# Patient Record
Sex: Female | Born: 1995 | Race: Black or African American | Hispanic: No | Marital: Single | Smoking: Current every day smoker
Health system: Southern US, Community
[De-identification: ages and names within clinical notes are randomized; demographics above are authoritative.]

## PROBLEM LIST (undated history)

## (undated) ENCOUNTER — Emergency Department (HOSPITAL_COMMUNITY): Disposition: A | Discharge status: Home / Self Care | Payer: Self-pay

## (undated) DIAGNOSIS — I10 Essential (primary) hypertension: Secondary | ICD-10-CM

## (undated) DIAGNOSIS — K5900 Constipation, unspecified: Secondary | ICD-10-CM

## (undated) DIAGNOSIS — J302 Other seasonal allergic rhinitis: Secondary | ICD-10-CM

---
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
## 2014-01-11 ENCOUNTER — Encounter (HOSPITAL_COMMUNITY): Payer: Self-pay | Admitting: Emergency Medicine

## 2014-01-11 ENCOUNTER — Emergency Department (HOSPITAL_COMMUNITY): Payer: Medicaid Other

## 2014-01-11 ENCOUNTER — Emergency Department (HOSPITAL_COMMUNITY)
Admission: EM | Admit: 2014-01-11 | Discharge: 2014-01-11 | Disposition: A | Discharge status: Home / Self Care | Payer: Medicaid Other | Attending: Emergency Medicine | Admitting: Emergency Medicine

## 2014-01-11 DIAGNOSIS — I10 Essential (primary) hypertension: Secondary | ICD-10-CM | POA: Diagnosis not present

## 2014-01-11 DIAGNOSIS — Z8719 Personal history of other diseases of the digestive system: Secondary | ICD-10-CM | POA: Insufficient documentation

## 2014-01-11 DIAGNOSIS — F17200 Nicotine dependence, unspecified, uncomplicated: Secondary | ICD-10-CM | POA: Insufficient documentation

## 2014-01-11 DIAGNOSIS — J069 Acute upper respiratory infection, unspecified: Secondary | ICD-10-CM | POA: Diagnosis not present

## 2014-01-11 DIAGNOSIS — R110 Nausea: Secondary | ICD-10-CM | POA: Diagnosis not present

## 2014-01-11 DIAGNOSIS — Z880 Allergy status to penicillin: Secondary | ICD-10-CM | POA: Diagnosis not present

## 2014-01-11 DIAGNOSIS — H9209 Otalgia, unspecified ear: Secondary | ICD-10-CM | POA: Insufficient documentation

## 2014-01-11 DIAGNOSIS — R52 Pain, unspecified: Secondary | ICD-10-CM | POA: Diagnosis present

## 2014-01-11 MED ORDER — FLUTICASONE PROPIONATE 50 MCG/ACT NA SUSP: 2.0000 | Freq: Every day | NASAL | Status: DC

## 2014-01-11 MED ORDER — GUAIFENESIN 100 MG/5ML PO LIQD: 100.0000 mg | ORAL | Status: DC | PRN

## 2014-01-11 NOTE — ED Notes (Signed)
Refused Wheelchair 

## 2014-01-11 NOTE — ED Notes (Signed)
Pt states that she has had cough, congestion, generalized body aches, fever, wheezing for 1 week. Pt state that she has taken OTC medications with no relief

## 2014-01-11 NOTE — ED Provider Notes (Signed)
CSN: [id number]     Arrival date & time 01/11/14  1443 History  This chart was scribed for non-physician practitioner, Junius Finner, PA-C working with Richardean Canal, MD by Karle Plumber, ED scribe. This patient was seen in room TR05C/TR05C and the patient's care was started at 4:32 PM.   Chief Complaint  Patient presents with  . Cough  . Generalized Body Aches   The history is provided by the patient. No language interpreter was used.   HPI Comments: Donna Mcclain is a 18 y.o. female who presents to the Emergency Department complaining of generalized body aches, fever Tmax 101 degrees and congestion. She reports associated sore throat and otalgia. She reports a productive cough of yellowish-green sputum. Pt reports some nausea as well. She has been taking Mucinex for the cough and congestion and Tylenol and Motrin for the body aches with no significant relief of the symptoms. She denies vomiting. She is a smoker of about 3 cigarettes daily.  Past Medical History  Diagnosis Date  . Constipation   . Seasonal allergies   . Hypertension    History reviewed. No pertinent past surgical history. Family History  Problem Relation Age of Onset  . Migraines Sister    History  Substance Use Topics  . Smoking status: Current Every Day Smoker -- 0.50 packs/day    Types: Cigarettes  . Smokeless tobacco: Not on file  . Alcohol Use: No   OB History   Grav Para Term Preterm Abortions TAB SAB Ect Mult Living                 Review of Systems  Constitutional: Positive for fever.  HENT: Positive for congestion, ear pain and sore throat.   Respiratory: Positive for cough and wheezing.   Gastrointestinal: Positive for nausea. Negative for vomiting.  Musculoskeletal: Positive for myalgias (generalized body aches).  All other systems reviewed and are negative.  Allergies  Cephalosporins and Penicillins  Home Medications   Prior to Admission medications   Medication Sig Start Date End  Date Taking? Authorizing Provider  acetaminophen (TYLENOL) 325 MG tablet Take 650 mg by mouth every 6 (six) hours as needed for mild pain, fever or headache.   Yes Historical Provider, MD  ibuprofen (ADVIL,MOTRIN) 200 MG tablet Take 400 mg by mouth every 6 (six) hours as needed for fever, headache or mild pain.   Yes Historical Provider, MD  fluticasone (FLONASE) 50 MCG/ACT nasal spray Place 2 sprays into both nostrils daily. 01/11/14   Junius Finner, PA-C  guaiFENesin (ROBITUSSIN) 100 MG/5ML liquid Take 5-10 mLs (100-200 mg total) by mouth every 4 (four) hours as needed for cough. 01/11/14   Junius Finner, PA-C   Triage Vitals:  BP 122/81  Pulse 86  Temp(Src) 98.3 F (36.8 C) (Oral)  Resp 20  SpO2 100%  LMP 11/20/2013  Physical Exam  Nursing note and vitals reviewed. Constitutional: She is oriented to person, place, and time. She appears well-developed and well-nourished.  HENT:  Head: Normocephalic and atraumatic.  Right Ear: Tympanic membrane normal.  Left Ear: Tympanic membrane normal.  Nose: Mucosal edema present.  Mouth/Throat: Uvula is midline, oropharynx is clear and moist and mucous membranes are normal.  Eyes: EOM are normal.  Neck: Normal range of motion.  Cardiovascular: Normal rate, regular rhythm and normal heart sounds.  Exam reveals no gallop and no friction rub.   No murmur heard. Pulmonary/Chest: Effort normal and breath sounds normal. No respiratory distress. She has no wheezes. She  has no rales.  Musculoskeletal: Normal range of motion.  Neurological: She is alert and oriented to person, place, and time.  Skin: Skin is warm and dry.  Psychiatric: She has a normal mood and affect. Her behavior is normal.    ED Course  Procedures (including critical care time) DIAGNOSTIC STUDIES: Oxygen Saturation is 100% on RA, normal by my interpretation.   COORDINATION OF CARE: 4:35 PM- Will order CXR. Pt verbalizes understanding and agrees to plan.  Medications - No data  to display  Labs Review Labs Reviewed - No data to display  Imaging Review Dg Chest 2 View  01/11/2014   CLINICAL DATA:  Cough.  EXAM: CHEST  2 VIEW  COMPARISON:  January 04, 2013.  FINDINGS: The heart size and mediastinal contours are within normal limits. Both lungs are clear. No pneumothorax or pleural effusion is noted. The visualized skeletal structures are unremarkable.  IMPRESSION: No acute cardiopulmonary abnormality seen.   Electronically Signed   By: Roque Lias M.D.   On: 01/11/2014 17:24     EKG Interpretation None      MDM   Final diagnoses:  URI, acute    Pt is a 18yo female presenting to ED with c/o URI type symptoms, no respiratory distress. Afebrile in ED.  Lungs: CTAB. CXR: no acute cardiopulmonary abnormality seen.  Will tx conservatively. Advised pt to use acetaminophen and ibuprofen as needed for fever and pain. Encouraged rest and fluids. Return precautions provided. Pt verbalized understanding and agreement with tx plan.   I personally performed the services described in this documentation, which was scribed in my presence. The recorded information has been reviewed and is accurate.  Junius Finner, PA-C 01/12/14 1623

## 2014-01-14 NOTE — ED Provider Notes (Signed)
Medical screening examination/treatment/procedure(s) were performed by non-physician practitioner and as supervising physician I was immediately available for consultation/collaboration.   EKG Interpretation None        Richardean Canal, MD 01/14/14 [phone]

## 2014-04-20 ENCOUNTER — Encounter (HOSPITAL_COMMUNITY): Payer: Self-pay | Admitting: *Deleted

## 2014-04-20 ENCOUNTER — Emergency Department (HOSPITAL_COMMUNITY)
Admission: EM | Admit: 2014-04-20 | Discharge: 2014-04-20 | Disposition: A | Discharge status: Home / Self Care | Payer: Medicaid Other | Attending: Emergency Medicine | Admitting: Emergency Medicine

## 2014-04-20 DIAGNOSIS — Z880 Allergy status to penicillin: Secondary | ICD-10-CM | POA: Insufficient documentation

## 2014-04-20 DIAGNOSIS — I10 Essential (primary) hypertension: Secondary | ICD-10-CM | POA: Insufficient documentation

## 2014-04-20 DIAGNOSIS — Z720 Tobacco use: Secondary | ICD-10-CM | POA: Diagnosis not present

## 2014-04-20 DIAGNOSIS — L03213 Periorbital cellulitis: Secondary | ICD-10-CM

## 2014-04-20 DIAGNOSIS — H00031 Abscess of right upper eyelid: Secondary | ICD-10-CM | POA: Insufficient documentation

## 2014-04-20 DIAGNOSIS — Z7951 Long term (current) use of inhaled steroids: Secondary | ICD-10-CM | POA: Insufficient documentation

## 2014-04-20 DIAGNOSIS — Z8719 Personal history of other diseases of the digestive system: Secondary | ICD-10-CM | POA: Diagnosis not present

## 2014-04-20 MED ORDER — CLINDAMYCIN HCL 300 MG PO CAPS: 300.0000 mg | ORAL_CAPSULE | Freq: Four times a day (QID) | ORAL | Status: DC

## 2014-04-20 NOTE — ED Provider Notes (Signed)
CSN: [id number]     Arrival date & time 04/20/14  1557 History  This chart was scribed for non-physician practitioner working with Tilden FossaElizabeth Rees, MD by Elveria Risingimelie Horne, ED Scribe. This patient was seen in room TR04C/TR04C and the patient's care was started at 5:05 PM.   Chief Complaint  Patient presents with  . Eye Problem   The history is provided by the patient. No language interpreter was used.   HPI Comments: Donna Mcclain is a 18 y.o. female with PMHx of Hypertension who presents to the Emergency Department complaining of small lump to right upper eyelid upon wakening three days ago. Patient reports development of increased swelling, itching, drainage and pain at the site. Patient states that last night when she woke up for work her eyelids where swollen shut. This morning patient reports awakening to crust/crusting. Patient reports blurred vision attributed to constant drainage. Patient denies known injury to the eye.    Past Medical History  Diagnosis Date  . Constipation   . Seasonal allergies   . Hypertension    History reviewed. No pertinent past surgical history. Family History  Problem Relation Age of Onset  . Migraines Sister    History  Substance Use Topics  . Smoking status: Current Every Day Smoker -- 0.50 packs/day    Types: Cigarettes  . Smokeless tobacco: Not on file  . Alcohol Use: No   OB History    No data available     Review of Systems  Constitutional: Negative for fever and chills.  Eyes: Positive for pain, discharge and itching.  All other systems reviewed and are negative.   Allergies  Cephalosporins and Penicillins  Home Medications   Prior to Admission medications   Medication Sig Start Date End Date Taking? Authorizing Provider  acetaminophen (TYLENOL) 325 MG tablet Take 650 mg by mouth every 6 (six) hours as needed for mild pain, fever or headache.    Historical Provider, MD  fluticasone (FLONASE) 50 MCG/ACT nasal spray Place 2  sprays into both nostrils daily. 01/11/14   Junius FinnerErin O'Malley, PA-C  guaiFENesin (ROBITUSSIN) 100 MG/5ML liquid Take 5-10 mLs (100-200 mg total) by mouth every 4 (four) hours as needed for cough. 01/11/14   Junius FinnerErin O'Malley, PA-C  ibuprofen (ADVIL,MOTRIN) 200 MG tablet Take 400 mg by mouth every 6 (six) hours as needed for fever, headache or mild pain.    Historical Provider, MD   Triage Vitals: BP 135/83 mmHg  Pulse 113  Temp(Src) 98.4 F (36.9 C) (Oral)  Resp 18  Ht 5\' 2"  (1.575 m)  Wt 165 lb (74.844 kg)  BMI 30.17 kg/m2  SpO2 99%  LMP 04/13/2014 Physical Exam  Constitutional: She is oriented to person, place, and time. She appears well-developed and well-nourished. No distress.  HENT:  Head: Normocephalic and atraumatic.  Eyes: EOM are normal.  Swelling to upper lid of right eye. No obvious stye noted.   Neck: Neck supple. No tracheal deviation present.  Cardiovascular: Normal rate.   Pulmonary/Chest: Effort normal. No respiratory distress.  Musculoskeletal: Normal range of motion.  Neurological: She is alert and oriented to person, place, and time.  Skin: Skin is warm and dry.  Psychiatric: She has a normal mood and affect. Her behavior is normal.  Nursing note and vitals reviewed.   ED Course  Procedures (including critical care time)  COORDINATION OF CARE: 5:12 PM- Discussed treatment plan with patient at bedside and patient agreed to plan.   Labs Review Labs Reviewed - No data  to display  Imaging Review No results found.   EKG Interpretation None      MDM   Final diagnoses:  None    Preseptal cellulitis. RX for clindamycin. Recheck in 24 hours.  I personally performed the services described in this documentation, which was scribed in my presence. The recorded information has been reviewed and is accurate.      Jimmye Normanavid John Howard Patton, NP 04/21/14 0155  Jimmye Normanavid John Aleesha Ringstad, NP 04/21/14 0155  Tilden FossaElizabeth Rees, MD 04/22/14 1500

## 2014-04-20 NOTE — ED Notes (Signed)
Pt reports onset Tuesday of small bump to eyelid and then woke up next day with swelling, drainage, itching, pain to right eye.

## 2014-04-20 NOTE — Discharge Instructions (Signed)
RETURN IN 24 HOURS FOR A RECHECK.   Periorbital Cellulitis Periorbital cellulitis is a common infection that can affect the eyelid and the soft tissues that surround the eyeball. The infection may also affect the structures that produce and drain tears. It does not affect the eyeball itself. Natural tissue barriers usually prevent the spread of this infection to the eyeball and other deeper areas of the eye socket.  CAUSES  Bacterial infection.  Long-term (chronic) sinus infections.  An object (foreign body) stuck behind the eye.  An injury that goes through the eyelid tissues.  An injury that causes an infection, such as an insect sting.  Fracture of the bone around the eye.  Infections which have spread from the eyelid or other structures around the eye.  Bite wounds.  Inflammation or infection of the lining membranes of the brain (meningitis).  An infection in the blood (septicemia).  Dental infection (abscess).  Viral infection (this is rare). SYMPTOMS Symptoms usually come on suddenly.  Pain in the eye.  Red, hot, and swollen eyelids and possibly cheeks. The swelling is sometimes bad enough that the eyelids cannot open. Some infections make the eyelids look purple.  Fever and feeling generally ill.  Pain when touching the area around the eye. DIAGNOSIS  Periorbital cellulitis can be diagnosed from an eye exam. In severe cases, your caregiver might suggest:  Blood tests.  Imaging tests (such as a CT scan) to examine the sinuses and the area around and behind the eyeball. TREATMENT If your caregiver feels that you do not have any signs of serious infection, treatment may include:  Antibiotics.  Nasal decongestants to reduce swelling.  Referral to a dentist if it is suspected that the infection was caused by a prior tooth infection.  Examination every day to make sure the problem is improving. HOME CARE INSTRUCTIONS  Take your antibiotics as directed.  Finish them even if you start to feel better.  Some pain is normal with this condition. Take pain medicine as directed by your caregiver. Only take pain medicines approved by your caregiver.  It is important to drink fluids. Drink enough water and fluids to keep your urine clear or pale yellow.  Do not smoke.  Rest and get plenty of sleep.  Mild or moderate fevers generally have no long-term effects and often do not require treatment.  If your caregiver has given you a follow-up appointment, it is very important to keep that appointment. Your caregiver will need to make sure that the infection is getting better. It is important to check that a more serious infection is not developing. SEEK IMMEDIATE MEDICAL CARE IF:  Your eyelids become more painful, red, warm, or swollen.  You develop double vision or your vision becomes blurred or worsens in any way.  You have trouble moving your eyes.  The eye looks like it is popping out (proptosis).  You develop a severe headache, severe neck pain, or neck stiffness.  You develop repeated vomiting.  You have a fever or persistent symptoms for more than 72 hours.  You have a fever and your symptoms suddenly get worse. MAKE SURE YOU:  Understand these instructions.  Will watch your condition.  Will get help right away if you are not doing well or get worse. Document Released: 05/10/2010 Document Revised: 06/30/2011 Document Reviewed: 05/10/2010 [facility] Patient Information 2015 [place], [place]. This information is not intended to replace advice given to you by your health care provider. Make sure you discuss any questions  you have with your health care provider.

## 2014-06-13 ENCOUNTER — Encounter (HOSPITAL_COMMUNITY): Payer: Self-pay | Admitting: *Deleted

## 2014-06-13 ENCOUNTER — Emergency Department (HOSPITAL_COMMUNITY)
Admission: EM | Admit: 2014-06-13 | Discharge: 2014-06-13 | Disposition: A | Discharge status: Home / Self Care | Payer: Medicaid Other | Attending: Emergency Medicine | Admitting: Emergency Medicine

## 2014-06-13 ENCOUNTER — Emergency Department (HOSPITAL_COMMUNITY): Payer: Medicaid Other

## 2014-06-13 DIAGNOSIS — Y939 Activity, unspecified: Secondary | ICD-10-CM | POA: Insufficient documentation

## 2014-06-13 DIAGNOSIS — Y929 Unspecified place or not applicable: Secondary | ICD-10-CM | POA: Diagnosis not present

## 2014-06-13 DIAGNOSIS — Z8709 Personal history of other diseases of the respiratory system: Secondary | ICD-10-CM | POA: Diagnosis not present

## 2014-06-13 DIAGNOSIS — M542 Cervicalgia: Secondary | ICD-10-CM | POA: Diagnosis present

## 2014-06-13 DIAGNOSIS — X58XXXA Exposure to other specified factors, initial encounter: Secondary | ICD-10-CM | POA: Diagnosis not present

## 2014-06-13 DIAGNOSIS — T148XXA Other injury of unspecified body region, initial encounter: Secondary | ICD-10-CM

## 2014-06-13 DIAGNOSIS — M25519 Pain in unspecified shoulder: Secondary | ICD-10-CM | POA: Diagnosis not present

## 2014-06-13 DIAGNOSIS — I10 Essential (primary) hypertension: Secondary | ICD-10-CM | POA: Insufficient documentation

## 2014-06-13 DIAGNOSIS — Z720 Tobacco use: Secondary | ICD-10-CM | POA: Insufficient documentation

## 2014-06-13 DIAGNOSIS — R079 Chest pain, unspecified: Secondary | ICD-10-CM | POA: Insufficient documentation

## 2014-06-13 DIAGNOSIS — Z8719 Personal history of other diseases of the digestive system: Secondary | ICD-10-CM | POA: Insufficient documentation

## 2014-06-13 DIAGNOSIS — S161XXA Strain of muscle, fascia and tendon at neck level, initial encounter: Secondary | ICD-10-CM | POA: Diagnosis not present

## 2014-06-13 DIAGNOSIS — Z880 Allergy status to penicillin: Secondary | ICD-10-CM | POA: Diagnosis not present

## 2014-06-13 DIAGNOSIS — Z7951 Long term (current) use of inhaled steroids: Secondary | ICD-10-CM | POA: Diagnosis not present

## 2014-06-13 DIAGNOSIS — Y998 Other external cause status: Secondary | ICD-10-CM | POA: Insufficient documentation

## 2014-06-13 LAB — CBC WITH DIFFERENTIAL/PLATELET
Basophils Absolute: 0.1 10*3/uL (ref 0.0–0.1)
Basophils Relative: 1 % (ref 0–1)
EOS ABS: 0.4 10*3/uL (ref 0.0–0.7)
EOS PCT: 4 % (ref 0–5)
HCT: 35.9 % — ABNORMAL LOW (ref 36.0–46.0)
Hemoglobin: 11.9 g/dL — ABNORMAL LOW (ref 12.0–15.0)
LYMPHS ABS: 3.4 10*3/uL (ref 0.7–4.0)
Lymphocytes Relative: 40 % (ref 12–46)
MCH: 27.5 pg (ref 26.0–34.0)
MCHC: 33.1 g/dL (ref 30.0–36.0)
MCV: 82.9 fL (ref 78.0–100.0)
Monocytes Absolute: 0.5 10*3/uL (ref 0.1–1.0)
Monocytes Relative: 6 % (ref 3–12)
Neutro Abs: 4.3 10*3/uL (ref 1.7–7.7)
Neutrophils Relative %: 49 % (ref 43–77)
Platelets: 307 10*3/uL (ref 150–400)
RBC: 4.33 MIL/uL (ref 3.87–5.11)
RDW: 11.6 % (ref 11.5–15.5)
WBC: 8.5 10*3/uL (ref 4.0–10.5)

## 2014-06-13 LAB — I-STAT CHEM 8, ED
BUN: 9 mg/dL (ref 6–23)
CALCIUM ION: 1.19 mmol/L (ref 1.12–1.23)
CHLORIDE: 105 mmol/L (ref 96–112)
Creatinine, Ser: 0.7 mg/dL (ref 0.50–1.10)
Glucose, Bld: 88 mg/dL (ref 70–99)
HCT: 41 % (ref 36.0–46.0)
Hemoglobin: 13.9 g/dL (ref 12.0–15.0)
Potassium: 3.8 mmol/L (ref 3.5–5.1)
SODIUM: 137 mmol/L (ref 135–145)
TCO2: 20 mmol/L (ref 0–100)

## 2014-06-13 MED ORDER — HYDROCODONE-ACETAMINOPHEN 5-325 MG PO TABS: 1.0000 | ORAL_TABLET | ORAL | Status: DC | PRN

## 2014-06-13 MED ORDER — HYDROCODONE-ACETAMINOPHEN 5-325 MG PO TABS
1.0000 | ORAL_TABLET | Freq: Once | ORAL | Status: AC
  Administered 2014-06-13: 1 via ORAL
  Filled 2014-06-13: qty 1

## 2014-06-13 MED ORDER — CYCLOBENZAPRINE HCL 10 MG PO TABS: 10.0000 mg | ORAL_TABLET | Freq: Two times a day (BID) | ORAL | Status: DC | PRN

## 2014-06-13 MED ORDER — CYCLOBENZAPRINE HCL 10 MG PO TABS
5.0000 mg | ORAL_TABLET | Freq: Once | ORAL | Status: AC
  Administered 2014-06-13: 5 mg via ORAL
  Filled 2014-06-13: qty 1

## 2014-06-13 NOTE — Discharge Instructions (Signed)

## 2014-06-13 NOTE — ED Provider Notes (Signed)
CSN: [id number]     Arrival date & time 06/13/14  [id number] History   First MD Initiated Contact with Patient 06/13/14 [phone]     Chief Complaint  Patient presents with  . Neck Pain  . Arm Pain  . Chest Pain  . Abdominal Pain     (Consider location/radiation/quality/duration/timing/severity/associated sxs/prior Treatment) HPI Comments: Pt c/o left neck and shoulder pain times 2 days. No known injury. Pain is worse with movement. Denies cp or sob. 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 Denies numbness or weakness. Has had a cough. Swelling of extremities. Nothing makes the pain better or worse. Has taken ibuprofen without relief  The history is provided by the patient. No language interpreter was used.    Past Medical History  Diagnosis Date  . Constipation   . Seasonal allergies   . Hypertension    History reviewed. No pertinent past surgical history. Family History  Problem Relation Age of Onset  . Migraines Sister    History  Substance Use Topics  . Smoking status: Current Every Day Smoker -- 0.50 packs/day    Types: Cigarettes  . Smokeless tobacco: Not on file  . Alcohol Use: No   OB History    No data available     Review of Systems  All other systems reviewed and are negative.     Allergies  Cephalosporins and Penicillins  Home Medications   Prior to Admission medications   Medication Sig Start Date End Date Taking? Authorizing Provider  medroxyPROGESTERone (DEPO-PROVERA) 150 MG/ML injection Inject 150 mg into the muscle every 3 (three) months.   Yes Historical Provider, MD  acetaminophen (TYLENOL) 325 MG tablet Take 650 mg by mouth every 6 (six) hours as needed for mild pain, fever or headache.    Historical Provider, MD  clindamycin (CLEOCIN) 300 MG capsule Take 1 capsule (300 mg total) by mouth 4 (four) times daily. X 7 days Patient not taking: Reported on 06/13/2014 04/20/14   Jimmye Normanavid John Smith, NP  fluticasone [facility](FLONASE) 50 MCG/ACT nasal  spray Place 2 sprays into both nostrils daily. Patient not taking: Reported on 06/13/2014 01/11/14   Junius FinnerErin O'Malley, PA-C  guaiFENesin (ROBITUSSIN) 100 MG/5ML liquid Take 5-10 mLs (100-200 mg total) by mouth every 4 (four) hours as needed for cough. Patient not taking: Reported on 06/13/2014 01/11/14   Junius FinnerErin O'Malley, PA-C   BP 111/79 mmHg  Pulse 92  Temp(Src) 97.5 F (36.4 C) (Oral)  Resp 20  SpO2 100% Physical Exam  Constitutional: She is oriented to person, place, and time. She appears well-developed and well-nourished.  HENT:  Head: Normocephalic and atraumatic.  Right Ear: External ear normal.  Left Ear: External ear normal.  Cardiovascular: Normal rate and regular rhythm.   Pulmonary/Chest: Effort normal and breath sounds normal. She exhibits no tenderness.  Abdominal: Soft. Bowel sounds are normal. There is no tenderness.  Musculoskeletal:  Left cervical paraspinal tenderness. Full rom of extremities  Neurological: She is alert and oriented to person, place, and time.  Skin: Skin is warm.  Nursing note and vitals reviewed.   ED Course  Procedures (including critical care time) Labs Review Labs Reviewed  CBC WITH DIFFERENTIAL/PLATELET - Abnormal; Notable for the following:    Hemoglobin 11.9 (*)    HCT 35.9 (*)    All other components within normal limits  I-STAT CHEM 8, ED    Imaging Review Dg Chest 2 View  06/13/2014   CLINICAL DATA:  Left posterior  chest and scapular pain. Neck pain for 2 days.  EXAM: CHEST  2 VIEW  COMPARISON:  01/11/2014  FINDINGS: Midline trachea.  Normal heart size and mediastinal contours.  Sharp costophrenic angles.  No pneumothorax.  Clear lungs.  IMPRESSION: No active cardiopulmonary disease.   Electronically Signed   By: Jeronimo Greaves M.D.   On: 06/13/2014 12:15   Dg Cervical Spine Complete  06/13/2014   CLINICAL DATA:  Left posterior chest and scapular pain. Neck pain for 2 days.  EXAM: CERVICAL SPINE  4+ VIEWS  COMPARISON:  None.  FINDINGS: The  lateral view images through bb the bottom of C7. Prevertebral soft tissues are within normal limits. Maintenance of vertebral body height and alignment. Intervertebral disc heights are maintained. Lateral masses and odontoid process partially obscured. Grossly within normal limits.  IMPRESSION: No acute osseous abnormality.   Electronically Signed   By: Jeronimo Greaves M.D.   On: 06/13/2014 12:17     EKG Interpretation   Date/Time:  Tuesday June 13 2014 10:05:23 EST Ventricular Rate:  86 PR Interval:  128 QRS Duration: 87 QT Interval:  344 QTC Calculation: 411 R Axis:   81 Text Interpretation:  Sinus arrhythmia Sinus rhythm Sinus arrhythmia  Artifact Abnormal ekg \\E \ Confirmed by Gerhard Munch  MD ([id number]) on  06/13/2014 10:24:56 AM      MDM   Final diagnoses:  Chest pain  Muscle strain    Pt feeling a lot better with hydrocodone and flexeril. Will treat symptomatically for muscle strain. Doubt cardiac in nature.   Teressa Lower, NP 06/13/14 1245  Gerhard Munch, MD 06/13/14 [phone]

## 2014-06-13 NOTE — ED Notes (Signed)
Pt ambulated to restroom with steady gait.

## 2014-06-13 NOTE — ED Notes (Signed)
brought pt to room via wheelchair; pt getting undressed and into a gown; Foye ClockKristina, EMT aware of pt

## 2014-06-13 NOTE — ED Notes (Signed)
Patient reports that she had onset of left sided pain on Sunday.  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  She denies any recent trauma.  She states the pain is on her entire right side.  From her neck/shoulder to her arm, her side and her back.  She also reports diarrhea for the past 2 weeks.  Denies any n/v.  She was started on Depo [id number].  Patient is alert.,  She reports she is having difficulty finding a comfortable position.  She denies any known hx of MS or other neuro/muscular disorders.  Patient had tried motrin for pain w/o relief.

## 2014-06-30 ENCOUNTER — Emergency Department (HOSPITAL_COMMUNITY): Payer: Medicaid Other

## 2014-06-30 ENCOUNTER — Emergency Department (HOSPITAL_COMMUNITY)
Admission: EM | Admit: 2014-06-30 | Discharge: 2014-06-30 | Disposition: A | Discharge status: Home / Self Care | Payer: Medicaid Other | Attending: Emergency Medicine | Admitting: Emergency Medicine

## 2014-06-30 ENCOUNTER — Encounter (HOSPITAL_COMMUNITY): Payer: Self-pay | Admitting: Emergency Medicine

## 2014-06-30 DIAGNOSIS — M545 Low back pain: Secondary | ICD-10-CM | POA: Insufficient documentation

## 2014-06-30 DIAGNOSIS — R1030 Lower abdominal pain, unspecified: Secondary | ICD-10-CM

## 2014-06-30 DIAGNOSIS — Z8709 Personal history of other diseases of the respiratory system: Secondary | ICD-10-CM | POA: Diagnosis not present

## 2014-06-30 DIAGNOSIS — Z3202 Encounter for pregnancy test, result negative: Secondary | ICD-10-CM | POA: Insufficient documentation

## 2014-06-30 DIAGNOSIS — J40 Bronchitis, not specified as acute or chronic: Secondary | ICD-10-CM | POA: Diagnosis not present

## 2014-06-30 DIAGNOSIS — R000 Tachycardia, unspecified: Secondary | ICD-10-CM | POA: Diagnosis not present

## 2014-06-30 DIAGNOSIS — I10 Essential (primary) hypertension: Secondary | ICD-10-CM | POA: Insufficient documentation

## 2014-06-30 DIAGNOSIS — Z79899 Other long term (current) drug therapy: Secondary | ICD-10-CM | POA: Diagnosis not present

## 2014-06-30 DIAGNOSIS — M549 Dorsalgia, unspecified: Secondary | ICD-10-CM

## 2014-06-30 DIAGNOSIS — K5900 Constipation, unspecified: Secondary | ICD-10-CM | POA: Insufficient documentation

## 2014-06-30 LAB — COMPREHENSIVE METABOLIC PANEL
ALBUMIN: 4.2 g/dL (ref 3.5–5.2)
ALK PHOS: 72 U/L (ref 39–117)
ALT: 12 U/L (ref 0–35)
AST: 20 U/L (ref 0–37)
Anion gap: 10 (ref 5–15)
BILIRUBIN TOTAL: 0.4 mg/dL (ref 0.3–1.2)
BUN: 9 mg/dL (ref 6–23)
CHLORIDE: 102 mmol/L (ref 96–112)
CO2: 25 mmol/L (ref 19–32)
Calcium: 9.2 mg/dL (ref 8.4–10.5)
Creatinine, Ser: 0.8 mg/dL (ref 0.50–1.10)
GFR calc Af Amer: >90 mL/min (ref >90)
GFR calc non Af Amer: >90 mL/min (ref >90)
Glucose, Bld: 90 mg/dL (ref 70–99)
POTASSIUM: 3.5 mmol/L (ref 3.5–5.1)
SODIUM: 137 mmol/L (ref 135–145)
TOTAL PROTEIN: 7.5 g/dL (ref 6.0–8.3)

## 2014-06-30 LAB — CBC WITH DIFFERENTIAL/PLATELET
BASOS PCT: 0 % (ref 0–1)
Basophils Absolute: 0 10*3/uL (ref 0.0–0.1)
Eosinophils Absolute: 0 10*3/uL (ref 0.0–0.7)
Eosinophils Relative: 0 % (ref 0–5)
HCT: 35 % — ABNORMAL LOW (ref 36.0–46.0)
HEMOGLOBIN: 11.5 g/dL — AB (ref 12.0–15.0)
Lymphocytes Relative: 23 % (ref 12–46)
Lymphs Abs: 1.4 10*3/uL (ref 0.7–4.0)
MCH: 27.3 pg (ref 26.0–34.0)
MCHC: 32.9 g/dL (ref 30.0–36.0)
MCV: 83.1 fL (ref 78.0–100.0)
MONOS PCT: 13 % — AB (ref 3–12)
Monocytes Absolute: 0.8 10*3/uL (ref 0.1–1.0)
NEUTROS ABS: 3.7 10*3/uL (ref 1.7–7.7)
NEUTROS PCT: 64 % (ref 43–77)
Platelets: 318 10*3/uL (ref 150–400)
RBC: 4.21 MIL/uL (ref 3.87–5.11)
RDW: 11.6 % (ref 11.5–15.5)
WBC: 5.9 10*3/uL (ref 4.0–10.5)

## 2014-06-30 LAB — URINALYSIS, ROUTINE W REFLEX MICROSCOPIC
BILIRUBIN URINE: NEGATIVE
Glucose, UA: NEGATIVE mg/dL
HGB URINE DIPSTICK: NEGATIVE
Ketones, ur: 15 mg/dL — AB
Leukocytes, UA: NEGATIVE
Nitrite: NEGATIVE
PH: 6 (ref 5.0–8.0)
Protein, ur: NEGATIVE mg/dL
SPECIFIC GRAVITY, URINE: 1.033 — AB (ref 1.005–1.030)
Urobilinogen, UA: 1 mg/dL (ref 0.0–1.0)

## 2014-06-30 LAB — TSH: TSH: 0.414 u[IU]/mL (ref 0.350–4.500)

## 2014-06-30 LAB — PREGNANCY, URINE: PREG TEST UR: NEGATIVE

## 2014-06-30 MED ORDER — SODIUM CHLORIDE 0.9 % IV BOLUS (SEPSIS)
1000.0000 mL | Freq: Once | INTRAVENOUS | Status: AC
  Administered 2014-06-30: 1000 mL via INTRAVENOUS

## 2014-06-30 MED ORDER — POLYETHYLENE GLYCOL 3350 17 G PO PACK: 17.0000 g | PACK | Freq: Every day | ORAL | Status: DC | PRN

## 2014-06-30 MED ORDER — MORPHINE SULFATE 4 MG/ML IJ SOLN
4.0000 mg | Freq: Once | INTRAMUSCULAR | Status: AC
  Administered 2014-06-30: 4 mg via INTRAVENOUS
  Filled 2014-06-30: qty 1

## 2014-06-30 MED ORDER — KETOROLAC TROMETHAMINE 30 MG/ML IJ SOLN
30.0000 mg | Freq: Once | INTRAMUSCULAR | Status: AC
  Administered 2014-06-30: 30 mg via INTRAVENOUS
  Filled 2014-06-30: qty 1

## 2014-06-30 MED ORDER — IBUPROFEN 800 MG PO TABS: 800.0000 mg | ORAL_TABLET | Freq: Three times a day (TID) | ORAL | Status: DC | PRN

## 2014-06-30 MED ORDER — METHOCARBAMOL 500 MG PO TABS: 500.0000 mg | ORAL_TABLET | Freq: Four times a day (QID) | ORAL | Status: DC | PRN

## 2014-06-30 MED ORDER — ONDANSETRON HCL 4 MG/2ML IJ SOLN
4.0000 mg | Freq: Once | INTRAMUSCULAR | Status: AC
  Administered 2014-06-30: 4 mg via INTRAVENOUS
  Filled 2014-06-30: qty 2

## 2014-06-30 NOTE — ED Notes (Signed)
Pt was here 2/23 for neck pain. Here today for c/o right back,right flank pain radiating to abd. Has had decreased appetite, nausea w/o vomiting. States HAS been able to drink fluids. HR 144 at present.

## 2014-06-30 NOTE — Discharge Instructions (Signed)
Read the information below.  Use the prescribed medication as directed.  Please discuss all new medications with your pharmacist.  You may return to the Emergency Department at any time for worsening condition or any new symptoms that concern you.     If you develop fevers, loss of control of bowel or bladder, weakness or numbness in your legs, or are unable to walk, return to the ER for a recheck. If you develop high fevers, worsening abdominal pain, uncontrolled vomiting, or are unable to tolerate fluids by mouth, return to the ER for a recheck.     Back Injury Prevention Back injuries can be extremely painful and difficult to heal. After having one back injury, you are much more likely to experience another later on. It is important to learn how to avoid injuring or re-injuring your back. The following tips can help you to prevent a back injury. PHYSICAL FITNESS  Exercise regularly and try to develop good tone in your abdominal muscles. Your abdominal muscles provide a lot of the support needed by your back.  Do aerobic exercises (walking, jogging, biking, swimming) regularly.  Do exercises that increase balance and strength (tai chi, yoga) regularly. This can decrease your risk of falling and injuring your back.  Stretch before and after exercising.  Maintain a healthy weight. The more you weigh, the more stress is placed on your back. For every pound of weight, 10 times that amount of pressure is placed on the back. DIET  Talk to your caregiver about how much calcium and vitamin D you need per day. These nutrients help to prevent weakening of the bones (osteoporosis). Osteoporosis can cause broken (fractured) bones that lead to back pain.  Include good sources of calcium in your diet, such as dairy products, green, leafy vegetables, and products with calcium added (fortified).  Include good sources of vitamin D in your diet, such as milk and foods that are fortified with vitamin  D.  Consider taking a nutritional supplement or a multivitamin if needed.  Stop smoking if you smoke. POSTURE  Sit and stand up straight. Avoid leaning forward when you sit or hunching over when you stand.  Choose chairs with good low back (lumbar) support.  If you work at a desk, sit close to your work so you do not need to lean over. Keep your chin tucked in. Keep your neck drawn back and elbows bent at a right angle. Your arms should look like the letter "L."  Sit high and close to the steering wheel when you drive. Add a lumbar support to your car seat if needed.  Avoid sitting or standing in one position for too long. Take breaks to get up, stretch, and walk around at least once every hour. Take breaks if you are driving for long periods of time.  Sleep on your side with your knees slightly bent, or sleep on your back with a pillow under your knees. Do not sleep on your stomach. LIFTING, TWISTING, AND REACHING  Avoid heavy lifting, especially repetitive lifting. If you must do heavy lifting:  Stretch before lifting.  Work slowly.  Rest between lifts.  Use carts and dollies to move objects when possible.  Make several small trips instead of carrying 1 heavy load.  Ask for help when you need it.  Ask for help when moving big, awkward objects.  Follow these steps when lifting:  Stand with your feet shoulder-width apart.  Get as close to the object as you can.  Do not try to pick up heavy objects that are far from your body.  Use handles or lifting straps if they are available.  Bend at your knees. Squat down, but keep your heels off the floor.  Keep your shoulders pulled back, your chin tucked in, and your back straight.  Lift the object slowly, tightening the muscles in your legs, abdomen, and buttocks. Keep the object as close to the center of your body as possible.  When you put a load down, use these same guidelines in reverse.  Do not:  Lift the object  above your waist.  Twist at the waist while lifting or carrying a load. Move your feet if you need to turn, not your waist.  Bend over without bending at your knees.  Avoid reaching over your head, across a table, or for an object on a high surface. OTHER TIPS  Avoid wet floors and keep sidewalks clear of ice to prevent falls.  Do not sleep on a mattress that is too soft or too hard.  Keep items that are used frequently within easy reach.  Put heavier objects on shelves at waist level and lighter objects on lower or higher shelves.  Find ways to decrease your stress, such as exercise, massage, or relaxation techniques. Stress can build up in your muscles. Tense muscles are more vulnerable to injury.  Seek treatment for depression or anxiety if needed. These conditions can increase your risk of developing back pain. SEEK MEDICAL CARE IF:  You injure your back.  You have questions about diet, exercise, or other ways to prevent back injuries. MAKE SURE YOU:  Understand these instructions.  Will watch your condition.  Will get help right away if you are not doing well or get worse. Document Released: 05/15/2004 Document Revised: 06/30/2011 Document Reviewed: 05/19/2011 [facility] Patient Information 2015 [place], [place]. This information is not intended to replace advice given to you by your health care provider. Make sure you discuss any questions you have with your health care provider.  Back Pain, Adult Low back pain is very common. About 1 in 5 people have back pain.The cause of low back pain is rarely dangerous. The pain often gets better over time.About half of people with a sudden onset of back pain feel better in just 2 weeks. About 8 in 10 people feel better by 6 weeks.  CAUSES Some common causes of back pain include:  Strain of the muscles or ligaments supporting the spine.  Wear and tear (degeneration) of the spinal discs.  Arthritis.  Direct injury to the  back. DIAGNOSIS Most of the time, the direct cause of low back pain is not known.However, back pain can be treated effectively even when the exact cause of the pain is unknown.Answering your caregiver's questions about your overall health and symptoms is one of the most accurate ways to make sure the cause of your pain is not dangerous. If your caregiver needs more information, he or she may order lab work or imaging tests (X-rays or MRIs).However, even if imaging tests show changes in your back, this usually does not require surgery. HOME CARE INSTRUCTIONS For many people, back pain returns.Since low back pain is rarely dangerous, it is often a condition that people can learn to [facility] their own.   Remain active. It is stressful on the back to sit or stand in one place. Do not sit, drive, or stand in one place for more than 30 minutes at a time. Take short walks  on level surfaces as soon as pain allows.Try to increase the length of time you walk each day.  Do not stay in bed.Resting more than 1 or 2 days can delay your recovery.  Do not avoid exercise or work.Your body is made to move.It is not dangerous to be active, even though your back may hurt.Your back will likely heal faster if you return to being active before your pain is gone.  Pay attention to your body when you bend and lift. Many people have less discomfortwhen lifting if they bend their knees, keep the load close to their bodies,and avoid twisting. Often, the most comfortable positions are those that put less stress on your recovering back.  Find a comfortable position to sleep. Use a firm mattress and lie on your side with your knees slightly bent. If you lie on your back, put a pillow under your knees.  Only take over-the-counter or prescription medicines as directed by your caregiver. Over-the-counter medicines to reduce pain and inflammation are often the most helpful.Your caregiver may prescribe muscle relaxant  drugs.These medicines help dull your pain so you can more quickly return to your normal activities and healthy exercise.  Put ice on the injured area.  Put ice in a plastic bag.  Place a towel between your skin and the bag.  Leave the ice on for 15-20 minutes, 03-04 times a day for the first 2 to 3 days. After that, ice and heat may be alternated to reduce pain and spasms.  Ask your caregiver about trying back exercises and gentle massage. This may be of some benefit.  Avoid feeling anxious or stressed.Stress increases muscle tension and can worsen back pain.It is important to recognize when you are anxious or stressed and learn ways to manage it.Exercise is a great option. SEEK MEDICAL CARE IF:  You have pain that is not relieved with rest or medicine.  You have pain that does not improve in 1 week.  You have new symptoms.  You are generally not feeling well. SEEK IMMEDIATE MEDICAL CARE IF:   You have pain that radiates from your back into your legs.  You develop new bowel or bladder control problems.  You have unusual weakness or numbness in your arms or legs.  You develop nausea or vomiting.  You develop abdominal pain.  You feel faint. Document Released: 04/07/2005 Document Revised: 10/07/2011 Document Reviewed: 08/09/2013 [facility] Patient Information 2015 [place], [place]. This information is not intended to replace advice given to you by your health care provider. Make sure you discuss any questions you have with your health care provider.  Abdominal Pain Many things can cause belly (abdominal) pain. Most times, the belly pain is not dangerous. Many cases of belly pain can be watched and treated at home. HOME CARE   Do not take medicines that help you go poop (laxatives) unless told to by your doctor.  Only take medicine as told by your doctor.  Eat or drink as told by your doctor. Your doctor will tell you if you should be on a special diet. GET HELP  IF:  You do not know what is causing your belly pain.  You have belly pain while you are sick to your stomach (nauseous) or have runny poop (diarrhea).  You have pain while you pee or poop.  Your belly pain wakes you up at night.  You have belly pain that gets worse or better when you eat.  You have belly pain that gets worse when  you eat fatty foods.  You have a fever. GET HELP RIGHT AWAY IF:   The pain does not go away within 2 hours.  You keep throwing up (vomiting).  The pain changes and is only in the right or left part of the belly.  You have bloody or tarry looking poop. MAKE SURE YOU:   Understand these instructions.  Will watch your condition.  Will get help right away if you are not doing well or get worse. Document Released: 09/24/2007 Document Revised: 04/12/2013 Document Reviewed: 12/15/2012  [facility] Patient Information 2015 [place], [place]. This information is not intended to replace advice given to you by your health care provider. Make sure you discuss any questions you have with your health care provider.  Constipation Constipation is when a person:  Poops (has a bowel movement) less than 3 times a week.  Has a hard time pooping.  Has poop that is dry, hard, or bigger than normal. HOME CARE   Eat foods with a lot of fiber in them. This includes fruits, vegetables, beans, and whole grains such as brown rice.  Avoid fatty foods and foods with a lot of sugar. This includes french fries, hamburgers, cookies, candy, and soda.  If you are not getting enough fiber from food, take products with added fiber in them (supplements).  Drink enough fluid to keep your pee (urine) clear or pale yellow.  Exercise on a regular basis, or as told by your doctor.  Go to the restroom when you feel like you need to poop. Do not hold it.  Only take medicine as told by your doctor. Do not take medicines that help you poop (laxatives) without talking to your doctor  first. GET HELP RIGHT AWAY IF:   You have bright red blood in your poop (stool).  Your constipation lasts more than 4 days or gets worse.  You have belly (abdominal) or butt (rectal) pain.  You have thin poop (as thin as a pencil).  You lose weight, and it cannot be explained. MAKE SURE YOU:   Understand these instructions.  Will watch your condition.  Will get help right away if you are not doing well or get worse. Document Released: 09/24/2007 Document Revised: 04/12/2013 Document Reviewed: 01/17/2013 [facility] Patient Information 2015 [place], [place]. This information is not intended to replace advice given to you by your health care provider. Make sure you discuss any questions you have with your health care provider.    Emergency Department Resource Guide 1) Find a Doctor and Pay Out of Pocket Although you won't have to find out who is covered by your insurance plan, it is a good idea to ask around and get recommendations. You will then need to call the office and see if the doctor you have chosen will accept you as a new patient and what types of options they offer for patients who are self-pay. Some doctors offer discounts or will set up payment plans for their patients who do not have insurance, but you will need to ask so you aren't surprised when you get to your appointment.  2) Contact Your Local Health Department Not all health departments have doctors that can see patients for sick visits, but many do, so it is worth a call to see if yours does. If you don't know where your local health department is, you can check in your phone book. The CDC also has a tool to help you locate your state's health department, and many state websites also  have listings of all of their local health departments.  3) Find a [facility] If your illness is not likely to be very severe or complicated, you may want to try a walk in clinic. These are popping up all over the country in pharmacies,  drugstores, and shopping centers. They're usually staffed by nurse practitioners or physician assistants that have been trained to treat common illnesses and complaints. They're usually fairly quick and inexpensive. However, if you have serious medical issues or chronic medical problems, these are probably not your best option.  No Primary Care Doctor: - Call Health Connect at  [phone] - they can help you locate a primary care doctor that  accepts your insurance, provides certain services, etc. - Physician Referral Service- [phone]  Chronic Pain Problems: Organization         Address  Phone   Notes  [facility]  [phone] Patients need to be referred by their primary care doctor.   Medication Assistance: Organization         Address  Phone   Notes  [facility] Medication [facility] [address] [place], [place] [address] [phone] --Must be a resident of [facility] -- Must have NO insurance coverage whatsoever (no Medicaid/ Medicare, etc.) -- The pt. MUST have a primary care doctor that directs their care regularly and follows them in the community   MedAssist  [phone]   [employer]  [phone]    Agencies that provide inexpensive medical care: Organization         Address  Phone   Notes  [place]  [phone]   [facility]    [phone]   [facility] [place], [place] [address] [phone]   [place] [address], [place] [phone]   Planned Parenthood    [phone]   [facility]    [phone]   [place] and [place] [address], [place] Phone:  [phone], Fax:  [phone] Hours of Operation:  9 am - 6 pm, M-F.  Also accepts Medicaid/Medicare and self-pay.  [facility] for [place], [address], [place] Phone:  [phone], Fax: [phone]. Hours of Operation:  8:30 am - 5:30 pm, M-F.  Also accepts Medicaid and self-pay.  [facility] [place] [address], [place] Phone: [phone]   [place], [place], [place] [phone], Ext. [phone] Mondays & Thursdays: 7-9 AM.  First 15 patients are seen on a first come, first serve basis.    [place] Providers:  Organization         Address  Phone   Notes  [facility] [address], [place] [phone] Also accepts self-pay patients.  [facility] [address], [place]  [phone]   [place], [address], [place] [phone]   [facility] [address], [place] [phone]   Lucianne Lei [address], [place]   [phone] Only accepts [place] Access [place] patients after they have their name applied to their card.   Self-Pay (no insurance) in [facility]:  [employer]  Notes  Sickle Cell Patients, [place] [phone]   [facility] [place] [phone]   [facility] [place]  [place], [place],  [phone]   [facility]/Dr. Osei-Bonsu  [address] [place] or [address], [place] [phone] Phone number for both [place] and [place] locations is the same.  [facility] [address] [place] [phone]   [facility] [address] [place] or [address] [phone]   [facility] [address] [place] [phone], phone; [phone], fax Sees patients 1st and 3rd Saturday of every month.  Must not qualify for public  or private insurance (i.e. Medicaid, Medicare, [place] Health Choice, Veterans' Benefits)  Household income should be no more than 200% of the poverty level The clinic cannot treat you if you are pregnant or think you are pregnant  Sexually transmitted diseases are not treated at the clinic.    Dental Care: Organization         Address  Phone  Notes  [facility] 2 Department of [facility] [place] [phone] Accepts children up to age 98 who are enrolled in [place] or [place]; pregnant women with a Medicaid card; and children who have applied for Medicaid or [place] Health Choice, but were declined, whose parents can pay a reduced fee at time of service.  [facility] Department of [facility]  [address], [place] [phone] Accepts children up to age 37 who are enrolled in [place] or [place]; pregnant women with a Medicaid card; and children who have applied for Medicaid or [place] Health Choice, but were declined, whose parents can pay a reduced fee at time of service.  [place] Adult Dental Access PROGRAM  [place] [phone] Patients are seen by appointment only. Walk-ins are not accepted. [place] will see patients 10 years of age and older. Monday - Tuesday (8am-5pm) Most Wednesdays (8:30-5pm) $30 per visit, cash only  [facility] Adult Dental Access PROGRAM  [address], [facility] [phone] Patients are seen by appointment only. Walk-ins are not accepted. [place] will see patients 11 years of age and older. One Wednesday Evening (Monthly: Volunteer Based).  $30 per visit, cash only  [place]  [phone] for adults; Children under age 55, call Graduate Pediatric Dentistry at [phone]. Children aged 26-14, please call [phone] to request a pediatric application.  Dental services are provided in all areas of  dental care including fillings, crowns and bridges, complete and partial dentures, implants, gum treatment, root canals, and extractions. Preventive care is also provided. Treatment is provided to both adults and children. Patients are selected via a lottery and there is often a waiting list.   [facility] [address], [place]  [phone] [email or web address]   [facility] [address] [place], [place] [phone], Ext. [phone] Second and Fourth Thursday of each month, opens at 6:30 AM; Clinic ends at 9 AM.  Patients are seen on a first-come first-served basis, and a limited number are seen during each clinic.   [facility]  [address] Hillard Danker [place], [place] [phone]  Eligibility Requirements You must have lived in [place], [place], or [place] counties for at least the last three months.   You cannot be eligible for state or federal sponsored [employer], including [employer], [place], or [employer].   You generally cannot be eligible for healthcare insurance through your employer.    How to apply: Eligibility screenings are held every Tuesday and Wednesday afternoon from 1:00 pm until 4:00 pm. You do not need an appointment for the interview!  [facility] [address], [place], [place]   [place]  [place] Department  [place]  [phone]    Behavioral Health Resources in the Community: Intensive Outpatient Programs Organization         Address  Phone  Notes  [place]. [address] [place], [place] [phone]   [facility] Outpatient [address] [place], [place]   ADS: Alcohol & Drug Svcs [address] [place], [place]   [place] [address],  [place], [place] or  [phone]   Substance Abuse Resources Organization         Address  Phone  Notes  Alcohol and Drug Services  [phone]   [place]  [phone]   [facility] [place]   Chinita Pester  [phone]   Residential & Outpatient Substance Abuse Program  [phone]   Psychological Services Organization         Address  Phone  Notes  [facility] [place]  [place]  [phone]   [place] [address] [place] or [phone]    Mobile Crisis Teams Organization         Address  Phone  Notes  [facility], Mobile Crisis Care Unit  [phone]   [facility]  [address] [place], [place]   [place] [address] [place] [phone]    Self-Help/Support Groups Organization         Address  Phone             Notes  [place]. of [place] - variety of support groups  [place] Call for more information  Narcotics Anonymous (NA), [facility] [address], [employer] [place]  2 meetings at this location   Special educational needs teacher         Address  Phone  Notes  [facility] Residential Treatment [place],    [place]  [phone]   [facility]  [address] [place] [id number], [place], [place]   [place], [place] [phone] Admissions: 8am-3pm M-F  Incentives Substance [place] [address],    [place], [place] [phone]   [facility] [address] Jadene Pierini [place], [place]   [facility] [address]  [place], [place]   [facility]ight Programs - Intensive Outpatient [address] Kristeen Mans [address], [place], [place]   [facility] ([place].,  [place], [place] or [phone]   Residential  Treatment Services (RTS) [address], [place], [place] Accepts Medicaid  [facility] [place] [address]  [place] [phone] Substance Abuse/Addiction Treatment   [facility] Resources Organization  Address  Phone  Notes  [facility]  [phone]   Domenic Schwab, PhD [address] [place], [place]   [phone] or [phone]   [place], [place] [phone]   [address], [place], [place] [phone] Insurance/Medicaid/sponsorship through [facility] and Families [address] [place]                                    [place], [place] [phone] [place] [address], [place] [phone]    Dr. Adele Schilder  [phone]   [facility] Dept. 1) [address] [place] 2) [place] 3)  [place] [place] [address], [place] [phone]  [phone]   [place] [phone] or [phone] (After Hours)

## 2014-06-30 NOTE — ED Provider Notes (Signed)
CSN: [id number]     Arrival date & time 06/30/14  [id number] History   First MD Initiated Contact with Patient 06/30/14 0914     Chief Complaint  Patient presents with  . Back Pain  . Abdominal Pain  . Tachycardia     (Consider location/radiation/quality/duration/timing/severity/associated sxs/prior Treatment) The history is provided by the patient.     Pt with hx HTN presents with back pain, abdominal pain, intermittent numbness.  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  Pain is located throughout her entire back and is worse with movement, better with staying still.  States the pain occasionally radiates into her abdomen.  She has one episode last night of bilateral leg weakness and had to have help getting out of the bathtub, also had single episode of right foot numbness and a few episodes of right hand "locking up," during which she was not able to move her fingers.  Denies fevers, chills, loss of control of bowel or bladder, weakness of numbness of the extremities, saddle anesthesia, urinary, or vaginal complaints.    She also notes that she has had cough, sore throat, and rhinorrhea x 3-4 weeks and feels like she is worsening.  Denies chest pain.  Symptoms are present only at night.    Denies fevers, urinary symptoms, abnormal vaginal discharge or bleeding.  Is in a monogamous relationship with a woman and has no concern for STDs.  Has chronic constipation and is constipated now, no BM in 3 days.      Past Medical History  Diagnosis Date  . Constipation   . Seasonal allergies   . Hypertension    History reviewed. No pertinent past surgical history. Family History  Problem Relation Age of Onset  . Migraines Sister    History  Substance Use Topics  . Smoking status: Current Every Day Smoker -- 0.50 packs/day    Types:  Cigarettes  . Smokeless tobacco: Not on file  . Alcohol Use: No   OB History    No data available     Review of Systems  All other systems reviewed and are negative.     Allergies  Cephalosporins and Penicillins  Home Medications   Prior to Admission medications   Medication Sig Start Date End Date Taking? Authorizing Provider  acetaminophen (TYLENOL) 325 MG tablet Take 650 mg by mouth every 6 (six) hours as needed for mild pain, fever or headache.    Historical Provider, MD  clindamycin (CLEOCIN) 300 MG capsule Take 1 capsule (300 mg total) by mouth 4 (four) times daily. X 7 days Patient not taking: Reported on 06/13/2014 04/20/14   Felicie Mornavid Smith, NP  cyclobenzaprine (FLEXERIL) 10 MG tablet Take 1 tablet (10 mg total) by mouth 2 (two) times daily as needed for muscle spasms. 06/13/14   Teressa LowerVrinda Pickering, NP  fluticasone (FLONASE) 50 MCG/ACT nasal spray Place 2 sprays into both nostrils daily. Patient not taking: Reported on 06/13/2014 01/11/14   Junius FinnerErin O'Malley, PA-C  guaiFENesin (ROBITUSSIN) 100 MG/5ML liquid Take 5-10 mLs (100-200 mg total) by mouth every 4 (four) hours as needed for cough. Patient not taking: Reported on 06/13/2014 01/11/14   Junius FinnerErin O'Malley, PA-C  HYDROcodone-acetaminophen (NORCO/VICODIN) 5-325 MG per tablet Take 1-2 tablets by mouth every 4 (four) hours as needed. 06/13/14   Teressa LowerVrinda Pickering, NP  medroxyPROGESTERone (  DEPO-PROVERA) 150 MG/ML injection Inject 150 mg into the muscle every 3 (three) months.    Historical Provider, MD   BP 129/91 mmHg  Pulse 140  Temp(Src) 98.2 F (36.8 C) (Oral)  Resp 20  SpO2 100%  LMP 05/05/2014 Physical Exam  Constitutional: She appears well-developed and well-nourished. No distress.  HENT:  Head: Normocephalic and atraumatic.  Neck: Neck supple.  Cardiovascular: Regular rhythm.  Tachycardia present.   Pulmonary/Chest: Effort normal and breath sounds normal. No respiratory distress. She has no wheezes. She has no rales.   Abdominal: Soft. She exhibits no distension. There is tenderness in the suprapubic area. There is no rebound and no guarding.  Musculoskeletal:  Tender throughout entire back and spine.  No focal tenderness.  No crepitus or stepoffs.  Extremities:  Strength 5/5, sensation intact, distal pulses intact.     Neurological: She is alert.  Skin: She is not diaphoretic.  Nursing note and vitals reviewed.   ED Course  Procedures (including critical care time) Labs Review Labs Reviewed  URINALYSIS, ROUTINE W REFLEX MICROSCOPIC - Abnormal; Notable for the following:    Specific Gravity, Urine 1.033 (*)    Ketones, ur 15 (*)    All other components within normal limits  CBC WITH DIFFERENTIAL/PLATELET - Abnormal; Notable for the following:    Hemoglobin 11.5 (*)    HCT 35.0 (*)    Monocytes Relative 13 (*)    All other components within normal limits  COMPREHENSIVE METABOLIC PANEL  PREGNANCY, URINE  TSH  POC URINE PREG, ED    Imaging Review Dg Chest 2 View  06/30/2014   CLINICAL DATA:  Rapid heart rate for 2 days, back pain  EXAM: CHEST  2 VIEW  COMPARISON:  None.  FINDINGS: The heart size and mediastinal contours are within normal limits. Both lungs are clear. The visualized skeletal structures are unremarkable.  IMPRESSION: No active cardiopulmonary disease.   Electronically Signed   By: Elige Ko   On: 06/30/2014 10:37     EKG Interpretation None        Date: 06/30/2014  Rate: 116  Rhythm: sinus tachycardia  QRS Axis: normal  Intervals: normal  ST/T Wave abnormalities: normal  Conduction Disutrbances:none  Narrative Interpretation:   Old EKG Reviewed: none available    Filed Vitals:   06/30/14 1345  BP: 112/70  Pulse: 106  Temp:   Resp:       MDM   Final diagnoses:  Bilateral back pain, unspecified location  Lower abdominal pain  Bronchitis  Constipation, unspecified constipation type    Afebrile, nontoxic patient with back pain worse with movement, 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  Suspect this pain is all musculoskeletal.  Neurovascularly intact.  NO red flags for back pain.  She also has mild pain in her lower abdomen.  Denies urinary or vaginal symptoms.  UA negative.  She has chronic constipation and thinks her soreness is likely related to this as she has had no BM in 3 days.  D/C home with ibuprofen, robaxin, miralax, PCP resources for follow up.  Discussed result, findings, treatment, and follow up  with patient.  Pt given return precautions.  Pt verbalizes understanding and agrees with plan.         Trixie Dredge, PA-C 06/30/14 1558  Shon Baton, MD 07/01/14 1029

## 2014-06-30 NOTE — ED Notes (Signed)
Pt now states she experienced numbness in right foot, could not feel foot last night and also right hand "locking up"; pt also states experiencing leg weakness. Pins and needles feeling in abdomen.

## 2014-09-11 ENCOUNTER — Emergency Department (HOSPITAL_COMMUNITY): Payer: Medicaid Other

## 2014-09-11 ENCOUNTER — Encounter (HOSPITAL_COMMUNITY): Payer: Self-pay | Admitting: Emergency Medicine

## 2014-09-11 ENCOUNTER — Emergency Department (HOSPITAL_COMMUNITY)
Admission: EM | Admit: 2014-09-11 | Discharge: 2014-09-11 | Disposition: A | Discharge status: Home / Self Care | Payer: Medicaid Other | Attending: Emergency Medicine | Admitting: Emergency Medicine

## 2014-09-11 DIAGNOSIS — Z880 Allergy status to penicillin: Secondary | ICD-10-CM | POA: Diagnosis not present

## 2014-09-11 DIAGNOSIS — Z8719 Personal history of other diseases of the digestive system: Secondary | ICD-10-CM | POA: Insufficient documentation

## 2014-09-11 DIAGNOSIS — B349 Viral infection, unspecified: Secondary | ICD-10-CM | POA: Diagnosis not present

## 2014-09-11 DIAGNOSIS — Z3202 Encounter for pregnancy test, result negative: Secondary | ICD-10-CM | POA: Diagnosis not present

## 2014-09-11 DIAGNOSIS — R05 Cough: Secondary | ICD-10-CM

## 2014-09-11 DIAGNOSIS — Z720 Tobacco use: Secondary | ICD-10-CM | POA: Insufficient documentation

## 2014-09-11 DIAGNOSIS — I10 Essential (primary) hypertension: Secondary | ICD-10-CM | POA: Diagnosis not present

## 2014-09-11 DIAGNOSIS — R059 Cough, unspecified: Secondary | ICD-10-CM

## 2014-09-11 DIAGNOSIS — R0981 Nasal congestion: Secondary | ICD-10-CM | POA: Diagnosis present

## 2014-09-11 LAB — URINALYSIS, ROUTINE W REFLEX MICROSCOPIC
Bilirubin Urine: NEGATIVE
Glucose, UA: NEGATIVE mg/dL
HGB URINE DIPSTICK: NEGATIVE
Ketones, ur: NEGATIVE mg/dL
Leukocytes, UA: NEGATIVE
Nitrite: NEGATIVE
PROTEIN: NEGATIVE mg/dL
SPECIFIC GRAVITY, URINE: 1.024 (ref 1.005–1.030)
UROBILINOGEN UA: 1 mg/dL (ref 0.0–1.0)
pH: 8 (ref 5.0–8.0)

## 2014-09-11 LAB — I-STAT CHEM 8, ED
BUN: 7 mg/dL (ref 6–20)
CHLORIDE: 107 mmol/L (ref 101–111)
Calcium, Ion: 1.18 mmol/L (ref 1.12–1.23)
Creatinine, Ser: 0.7 mg/dL (ref 0.44–1.00)
GLUCOSE: 93 mg/dL (ref 65–99)
HEMATOCRIT: 36 % (ref 36.0–46.0)
Hemoglobin: 12.2 g/dL (ref 12.0–15.0)
Potassium: 3.8 mmol/L (ref 3.5–5.1)
Sodium: 139 mmol/L (ref 135–145)
TCO2: 21 mmol/L (ref 0–100)

## 2014-09-11 LAB — POC URINE PREG, ED: Preg Test, Ur: NEGATIVE

## 2014-09-11 MED ORDER — ONDANSETRON HCL 8 MG PO TABS: 8.0000 mg | ORAL_TABLET | Freq: Three times a day (TID) | ORAL | Status: DC | PRN

## 2014-09-11 MED ORDER — ONDANSETRON HCL 4 MG/2ML IJ SOLN
4.0000 mg | Freq: Once | INTRAMUSCULAR | Status: AC
  Administered 2014-09-11: 4 mg via INTRAVENOUS
  Filled 2014-09-11: qty 2

## 2014-09-11 MED ORDER — SODIUM CHLORIDE 0.9 % IV BOLUS (SEPSIS)
1000.0000 mL | Freq: Once | INTRAVENOUS | Status: AC
  Administered 2014-09-11: 1000 mL via INTRAVENOUS

## 2014-09-11 NOTE — Discharge Instructions (Signed)
Use saline nasal spray , and escort once into each nostril every 2 hours while awake to help with congestion. Use Imodium as directed for diarrhea. Take the medication prescribed as needed for nausea. Avoid milk or foods containing milk such as cheese or ice cream all having diarrhea. [facility] to get a primary care physician. Ask your new primary care physician to help you to stop smoking. Make sure that you drink at least  Six 8 ounce glasses of water or Gatorade each day so that you don't become dehydrated

## 2014-09-11 NOTE — ED Provider Notes (Signed)
CSN: [id number]     Arrival date & time 09/11/14  1042 History   First MD Initiated Contact with Patient 09/11/14 1051     Chief Complaint  Patient presents with  . Nasal Congestion     (Consider location/radiation/quality/duration/timing/severity/associated sxs/prior Treatment) HPI Complains of nasal congestion cough sneeze vomiting and diarrhea onset one week ago. Associated symptoms include lightheadedness worse with standing. She's had 2 episodes of vomiting this morning and 2 episodes of diarrhea. She is presently mildly nauseated. She denies abdominal pain. Nothing makes symptoms better or worse. No other associated symptoms. Past Medical History  Diagnosis Date  . Constipation   . Seasonal allergies   . Hypertension    History reviewed. No pertinent past surgical history. Family History  Problem Relation Age of Onset  . Migraines Sister    History  Substance Use Topics  . Smoking status: Current Every Day Smoker -- 0.50 packs/day    Types: Cigarettes  . Smokeless tobacco: Not on file  . Alcohol Use: No   no drug use OB History    No data available     Review of Systems  Constitutional: Negative.   HENT: Positive for congestion, sneezing and sore throat.   Respiratory: Positive for cough.   Cardiovascular: Negative.   Gastrointestinal: Positive for nausea, vomiting and diarrhea.  Musculoskeletal: Negative.   Skin: Negative.   Neurological: Positive for light-headedness.  Psychiatric/Behavioral: Negative.       Allergies  Cephalosporins and Penicillins  Home Medications   Prior to Admission medications   Medication Sig Start Date End Date Taking? Authorizing Provider  acetaminophen (TYLENOL) 500 MG tablet Take 1,500 mg by mouth every 6 (six) hours as needed for mild pain or moderate pain.   Yes Historical Provider, MD   BP 123/83 mmHg  Pulse 90  Temp(Src) 98.2 F (36.8 C) (Oral)  Resp 16  SpO2 100%  LMP 07/12/2014 Physical Exam  Constitutional:  She is oriented to person, place, and time. She appears well-developed and well-nourished. No distress.  HENT:  Head: Normocephalic and atraumatic.  Oral pharynx minimally reddened  no exudate. Uvula midline  Eyes: Conjunctivae are normal. Pupils are equal, round, and reactive to light.  Neck: Neck supple. No tracheal deviation present. No thyromegaly present.  Cardiovascular: Normal rate and regular rhythm.   No murmur heard. Pulmonary/Chest: Effort normal and breath sounds normal.  Abdominal: Soft. Bowel sounds are normal. She exhibits no distension. There is no tenderness.  Obese  Musculoskeletal: Normal range of motion. She exhibits no edema or tenderness.  Neurological: She is alert and oriented to person, place, and time. Coordination normal.  Skin: Skin is warm and dry. No rash noted.  Psychiatric: She has a normal mood and affect.  Nursing note and vitals reviewed.   ED Course  Procedures (including critical care time) Labs Review Labs Reviewed - No data to display  Imaging Review No results found.   EKG Interpretation None     2:50 PM feels improved after treatment with intravenous fluids and intravenous Zofran. Drink water without difficulty and without nausea. chestx-ray viewed by me. Results for orders placed or performed during the hospital encounter of 09/11/14  Urinalysis, Routine w reflex microscopic  Result Value Ref Range   Color, Urine YELLOW YELLOW   APPearance CLEAR CLEAR   Specific Gravity, Urine 1.024 1.005 - 1.030   pH 8.0 5.0 - 8.0   Glucose, UA NEGATIVE NEGATIVE mg/dL   Hgb urine dipstick NEGATIVE NEGATIVE   Bilirubin Urine  NEGATIVE NEGATIVE   Ketones, ur NEGATIVE NEGATIVE mg/dL   Protein, ur NEGATIVE NEGATIVE mg/dL   Urobilinogen, UA 1.0 0.0 - 1.0 mg/dL   Nitrite NEGATIVE NEGATIVE   Leukocytes, UA NEGATIVE NEGATIVE  I-stat chem 8, ed  Result Value Ref Range   Sodium 139 135 - 145 mmol/L   Potassium 3.8 3.5 - 5.1 mmol/L   Chloride 107 101  - 111 mmol/L   BUN 7 6 - 20 mg/dL   Creatinine, Ser 1.610.70 0.44 - 1.00 mg/dL   Glucose, Bld 93 65 - 99 mg/dL   Calcium, Ion 0.961.18 0.451.12 - 1.23 mmol/L   TCO2 21 0 - 100 mmol/L   Hemoglobin 12.2 12.0 - 15.0 g/dL   HCT 40.936.0 81.136.0 - 91.446.0 %  POC urine preg, [facility])  Result Value Ref Range   Preg Test, Ur NEGATIVE NEGATIVE   Dg Chest 2 View  09/11/2014   CLINICAL DATA:  Cough, congestion, hoarse voice  EXAM: CHEST  2 VIEW  COMPARISON:  06/30/2014  FINDINGS: Cardiomediastinal silhouette is stable. No acute infiltrate or pleural effusion. No pulmonary edema. Bony thorax is unremarkable.  IMPRESSION: No active cardiopulmonary disease.   Electronically Signed   By: Natasha MeadLiviu  Pop M.D.   On: 09/11/2014 14:12    MDM  Suspect viral illness. Plan prescription for Zofran, Imodium for diarrhea. Saline nasal spray. Referral [facility]. Counseled patient for 5 minutes on smoking cessation Diagnosis #1 viral illness #2 nausea vomiting diarrhea #3 tobacco abuse Final diagnoses:  None        Doug SouSam Trase Bunda, MD 09/11/14 1455

## 2014-09-11 NOTE — ED Notes (Signed)
Pt c/o productive yellow/brown cough, congestions, hoarse voice, chest congestion, body aches and n/v/d for 1 week. Pt denies any fevers, chills.

## 2014-09-11 NOTE — ED Notes (Signed)
Pt made aware she needs to be fully undressed

## 2014-09-11 NOTE — ED Notes (Signed)
Patient transported to X-ray 

## 2014-12-14 ENCOUNTER — Encounter (HOSPITAL_COMMUNITY): Payer: Self-pay | Admitting: Emergency Medicine

## 2014-12-14 ENCOUNTER — Emergency Department (HOSPITAL_COMMUNITY)
Admission: EM | Admit: 2014-12-14 | Discharge: 2014-12-14 | Disposition: A | Discharge status: Home / Self Care | Payer: Medicaid Other | Attending: Emergency Medicine | Admitting: Emergency Medicine

## 2014-12-14 ENCOUNTER — Emergency Department (HOSPITAL_BASED_OUTPATIENT_CLINIC_OR_DEPARTMENT_OTHER): Admit: 2014-12-14 | Discharge: 2014-12-14 | Disposition: A | Discharge status: Home / Self Care | Payer: Self-pay

## 2014-12-14 DIAGNOSIS — R05 Cough: Secondary | ICD-10-CM | POA: Insufficient documentation

## 2014-12-14 DIAGNOSIS — M79609 Pain in unspecified limb: Secondary | ICD-10-CM

## 2014-12-14 DIAGNOSIS — Z880 Allergy status to penicillin: Secondary | ICD-10-CM | POA: Insufficient documentation

## 2014-12-14 DIAGNOSIS — Z720 Tobacco use: Secondary | ICD-10-CM | POA: Insufficient documentation

## 2014-12-14 DIAGNOSIS — M79605 Pain in left leg: Secondary | ICD-10-CM

## 2014-12-14 DIAGNOSIS — J3489 Other specified disorders of nose and nasal sinuses: Secondary | ICD-10-CM | POA: Insufficient documentation

## 2014-12-14 DIAGNOSIS — Z8719 Personal history of other diseases of the digestive system: Secondary | ICD-10-CM | POA: Insufficient documentation

## 2014-12-14 DIAGNOSIS — I10 Essential (primary) hypertension: Secondary | ICD-10-CM | POA: Insufficient documentation

## 2014-12-14 NOTE — ED Notes (Signed)
Pt reports l/knee pain and swelling in lower leg x 5 days. C/o tenderness when palpated. Medicated with Motrin PM. Pt is ambulatory, limping  with pain. Denies trauma.

## 2014-12-14 NOTE — Progress Notes (Signed)
VASCULAR LAB PRELIMINARY  PRELIMINARY  PRELIMINARY  PRELIMINARY  Left lower extremity venous duplex completed.    Preliminary report:  Left:  No evidence of DVT, superficial thrombosis, or Baker's cyst.  Claudina Oliphant, RVS 12/14/2014, 5:23 PM

## 2014-12-14 NOTE — Discharge Instructions (Signed)
Take ibuprofen or tylenol as needed for your pain.  Use resources below to find a primary care provider for further management of your health.  You do not have any evidence of blood clot in your leg.   Emergency Department Resource Guide 1) Find a Doctor and Pay Out of Pocket Although you won't have to find out who is covered by your insurance plan, it is a good idea to ask around and get recommendations. You will then need to call the office and see if the doctor you have chosen will accept you as a new patient and what types of options they offer for patients who are self-pay. Some doctors offer discounts or will set up payment plans for their patients who do not have insurance, but you will need to ask so you aren't surprised when you get to your appointment.  2) Contact Your Local Health Department Not all health departments have doctors that can see patients for sick visits, but many do, so it is worth a call to see if yours does. If you don't know where your local health department is, you can check in your phone book. The CDC also has a tool to help you locate your state's health department, and many state websites also have listings of all of their local health departments.  3) Find a Walk-in Clinic If your illness is not likely to be very severe or complicated, you may want to try a walk in clinic. These are popping up all over the country in pharmacies, drugstores, and shopping centers. They're usually staffed by nurse practitioners or physician assistants that have been trained to treat common illnesses and complaints. They're usually fairly quick and inexpensive. However, if you have serious medical issues or chronic medical problems, these are probably not your best option.  No Primary Care Doctor: - Call Health Connect at  [phone] - they can help you locate a primary care doctor that  accepts your insurance, provides certain services, etc. - Physician Referral Service-  [phone]  Chronic Pain Problems: Organization         Address  Phone   Notes  [facility]  [phone] Patients need to be referred by their primary care doctor.   Medication Assistance: Organization         Address  Phone   Notes  [facility] Medication [facility] [address] [place] [place], [place] [address] [phone] --Must be a resident of [facility] -- Must have NO insurance coverage whatsoever (no Medicaid/ Medicare, etc.) -- The pt. MUST have a primary care doctor that directs their care regularly and follows them in the community   [facility]ist  [phone]   [employer]  [phone]    Agencies that provide inexpensive medical care: Organization         Address  Phone   Notes  [facility]  [phone]   [facility]    [phone]   [facility] [address] [place], [place] [address] [phone]   [facility] [address] [place] [place] [phone]   Planned Parenthood    [phone]   [facility]    [phone]   [facility] and [facility]  [address], [place] Phone:  [phone], Fax:  [phone] Hours of Operation:  9 am - 6 pm, M-F.  Also accepts Medicaid/Medicare and self-pay.  [facility] for [place], [address], [place] Phone: [phone], Fax: [phone]. Hours of Operation:  8:30 am - 5:30 pm, M-F.  Also accepts Medicaid and self-pay.  [facility] [place] [address] [place] Phone: [phone]   [place], [place], [place] [phone], Ext. [phone] Mondays & Thursdays: 7-9 AM.  First 15 patients are seen on a first come, first serve basis.    [place] Providers:  Organization         Address  Phone   Notes  [facility] [address],  [place] [phone] Also accepts self-pay patients.  [facility] [id number] [place], [place]  [phone]   [place], [address], [place] [phone]   [facility] [address] [place] [phone]   Lucianne Lei [address], [place]   [phone] Only accepts [place] Access [place] patients after they have their name applied to their card.   Self-Pay (no insurance) in [facility]:  Organization         Address  Phone   Notes  Sickle Cell Patients, [facility] [place] [phone]   [facility] [place] [phone]   [facility] [place]  [place], [place], [place] [phone]   [facility]/Dr. Osei-Bonsu  [address] [place] [address] [place] or [address], [place] [phone] Phone number for both [place] and [place] locations is the same.  Urgent Medical and [facility] [address], [place] [phone]   [facility] [address] [place] or [address] [phone]   [facility] [address], [place] [phone], phone; [phone], fax Sees patients 1st and 3rd Saturday of every month.  Must not qualify for public or private insurance (i.e. Medicaid, Medicare, [place] Health Choice, Veterans' Benefits)  Household income should be no more than 200% of the poverty level The clinic cannot treat you if you are pregnant or think you are pregnant  Sexually transmitted diseases are not treated at the clinic.    Dental Care: Organization         Address  Phone  Notes  [facility] Department of [facility] [place] [phone] Accepts children up to age 81 who are enrolled in  [place] or [place]; pregnant women with a Medicaid card; and children who have applied for Medicaid or  Health Choice, but were declined, whose parents can pay a reduced fee at time of service.  [facility] of [facility]  [address], [place] [phone] Accepts children up to age 44 who are enrolled in [place] or [place]; pregnant women with a Medicaid card; and children who have applied for Medicaid or  Health Choice, but were declined, whose parents can pay a reduced fee at time of service.  [facility]  [place] [phone] Patients are seen by appointment only. Walk-ins are not accepted. [place] will see patients 52 years of age and older. Monday - Tuesday (8am-5pm) Most Wednesdays (8:30-5pm) $30 per  visit, cash only  [facility] Adult [employer] PROGRAM  [address], [address] [phone] Patients are seen by appointment only. Walk-ins are not accepted. [place] will see patients 48 years of age and older. One Wednesday Evening (Monthly: Volunteer Based).  $30 per visit, cash only  [place]  [phone] for adults; Children under age 62, call Graduate Pediatric Dentistry at [phone]. Children aged 41-14, please call [phone] to request a pediatric application.  Dental services are provided in all areas of dental care including fillings, crowns and bridges, complete and partial dentures, implants, gum treatment, root canals, and extractions. Preventive care is also provided. Treatment is provided to both adults and children. Patients are selected via a lottery and there is often a waiting list.   [facility] [address] [place]  [phone] [email or web address]   [facility] [address] [place], [place] [phone], Ext. [phone] Second and Fourth Thursday of each month, opens at 6:30  AM; Clinic ends at 9 AM.  Patients are seen on a first-come first-served basis, and a limited number are seen during each clinic.   [facility]  [address] [place] Danker [place], [place] [phone]   Eligibility Requirements You must have lived in [place], [place], or [place] counties for at least the last three months.   You cannot be eligible for state or federal sponsored [employer], including [employer], [place], or [employer].   You generally cannot be eligible for healthcare insurance through your employer.    How to apply: Eligibility screenings are held every Tuesday and Wednesday afternoon from 1:00 pm until 4:00 pm. You do not need an appointment for the interview!  [facility] [address], [place], [place]   [place]  [place] Department  [place]  [phone]    Behavioral Health Resources in the Community: Intensive Outpatient Programs Organization         Address  Phone  Notes  [facility] [place]. [address] [place], [place] [phone]   [facility] Outpatient [address] [place], [place]   ADS: Alcohol & Drug S[facility] [address] [place], [place]   [place] [address]  [place], [place] or [phone]   Substance Abuse Resources Organization         Address  Phone  Notes  Alcohol and Drug Services  [phone]   [place]  [phone]   [facility] [place]   Chinita Pester  [phone]   Residential & Outpatient Substance Abuse Program  [phone]   Psychological Services Organization         Address  Phone  Notes  [facility] [place]  [place]  [phone]   [place] [address] [place] or  [phone]    Mobile Crisis Teams Organization         Address  Phone  Notes  [facility], Mobile Crisis Care Unit  [phone]   [facility]  [address] [place], [place]   [place] [address], [place] [phone]    Self-Help/Support Groups Organization         Address  Phone  Notes  [facility] [place] - variety of support groups  [place] Call for more information  Narcotics Anonymous (NA), [facility] [address], [employer] [place]  2 meetings at this location   Special educational needs teacher         Address  Phone  Notes  [facility] Residential Treatment [place],    [place]  [phone]   [facility]  [address] [place] [id number], [place], [place]   [place], [place] [phone] Admissions: 8am-3pm M-F  Incentives Substance [place] [address]    [place], [place] [phone]   [facility] [address] [place], [place], [place]   [facility] [address],  [place], [place]   [facility] - Intensive Outpatient [address] Kristeen [address], [place], [place]   [facility] ([place] [address].,  [place], [place] [phone] or [phone]   [facility] Services (RTS) [address] [place], [place] Accepts Medicaid  [facility] [place] [address],  [place] [phone] Substance Abuse/Addiction Treatment   [facility] Organization         Address  Phone  Notes  [facility]  [phone]   Domenic Schwab, PhD [address] Arlis [place], [place]   [phone] or [phone]   [place] and [place], [place] [phone]   [facility] [address],  [place], [place] [phone] Insurance/Medicaid/sponsorship through [facility] and Families [address], [address] [place]                                    [place], [place] [phone] [place] [address], [place] [phone]    Dr. Adele Schilder  [phone]   [facility] Dept. 1) [address], [place] 2) [place] 3)  [place] [address], [place] [phone]  [phone]   [place] [phone] or [phone] (After Hours)

## 2014-12-14 NOTE — ED Provider Notes (Signed)
CSN: [id number]   Arrival date & time 12/14/14 1504  History  This chart was scribed for non-physician practitioner, Fayrene Helper PA-C , working with Doug Sou, MD by Bethel Born, ED Scribe. This patient was seen in room WTR5/WTR5 and the patient's care was started at 3:31 PM.  Chief Complaint  Patient presents with  . Knee Pain  . Leg Swelling    swelling from the l/leg down to foot    HPI The history is provided by the patient. No language interpreter was used.   Donna Mcclain is a 19 y.o. female who presents to the Emergency Department complaining of new and atraumatic left leg pain with gradual onset 5 days ago. The constant pain is from the knee to the ankle. She describes the pain as pulling that is worse with bending the knee.  Motrin PM and Tiger balm provided insufficient pain relief PTA. Associated symptoms include left lower leg swelling for 3 days. Also complains of cough and runny nose. Pt denies SOB. No history of DVT/PE or cancer. Depo provera shots for birth control. No recent travel.    Past Medical History  Diagnosis Date  . Constipation   . Seasonal allergies   . Hypertension     History reviewed. No pertinent past surgical history.  Family History  Problem Relation Age of Onset  . Migraines Sister   . Hypertension Father     Social History  Substance Use Topics  . Smoking status: Current Every Day Smoker -- 0.50 packs/day    Types: Cigarettes  . Smokeless tobacco: None  . Alcohol Use: No     Review of Systems  HENT: Positive for rhinorrhea.   Respiratory: Positive for cough. Negative for shortness of breath.   Musculoskeletal:       Left leg pain and swelling    Home Medications   Prior to Admission medications   Medication Sig Start Date End Date Taking? Authorizing Provider  acetaminophen (TYLENOL) 500 MG tablet Take 1,500 mg by mouth every 6 (six) hours as needed for mild pain or moderate pain.    Historical Provider, MD  ondansetron  (ZOFRAN) 8 MG tablet Take 1 tablet (8 mg total) by mouth every 8 (eight) hours as needed for nausea. 09/11/14   Doug Sou, MD    Allergies  Cephalosporins and Penicillins  Triage Vitals: BP 132/91 mmHg  Pulse 119  Temp(Src) 98.7 F (37.1 C) (Oral)  Resp 18  SpO2 100%  Physical Exam  Constitutional: She is oriented to person, place, and time. She appears well-developed and well-nourished.  HENT:  Head: Normocephalic.  Eyes: EOM are normal.  Neck: Normal range of motion.  Pulmonary/Chest: Effort normal.  Abdominal: She exhibits no distension.  Musculoskeletal: Normal range of motion.  Left Leg:  Tenderness along gastrocnemius, calf muscle and anterior knee Knee with normal flexion and extension No joint deformity Pedal pulses intact No significant edema noted No palpable port or erythema  Neurological: She is alert and oriented to person, place, and time.  Psychiatric: She has a normal mood and affect.  Nursing note and vitals reviewed.   ED Course  Procedures  DIAGNOSTIC STUDIES: Oxygen Saturation is 100% on RA,  normal by my interpretation.    COORDINATION OF CARE: 3:36 PM patient with unexplained left knee and lower leg pain and swelling. No specific injury. She is neurovascularly intact. Although she does not have any significant risk factor for DVT, given the duration of her symptoms and the location of  her symptom, Discussed treatment plan which includes LLE [place] with pt at bedside and pt agreed to plan.  5:35 PM Doppler US without evidence of DVT.  Pt reassured.  Resources provided for outpt f/u.  Doubt cellulitis, fx/dislocation, septic joint or other acute emergent condition.   Labs Review- Labs Reviewed - No data to display  Imaging Review No results found.   Nereida, Schepp T Female 1996/03/03 [id number]    Progress Notes by Kerrin Champagne at 12/14/2014 5:22 PM    Author: Kerrin Champagne Service: Vascular Lab Author Type: Cardiovascular  Sonographer   Filed: 12/14/2014 5:23 PM Note Time: 12/14/2014 5:22 PM Status: Signed   Editor: Nolberto Hanlon Slaughter (Cardiovascular Sonographer)     Expand All Collapse All   VASCULAR LAB PRELIMINARY PRELIMINARY PRELIMINARY PRELIMINARY  Left lower extremity venous duplex completed.   Preliminary report: Left: No evidence of DVT, superficial thrombosis, or Baker's cyst.  SLAUGHTER, [place], RVS 12/14/2014, 5:23 PM         I, Fayrene Helper PA-C, personally reviewed and evaluated these images and lab results as part of my medical decision-making.       MDM   Final diagnoses:  Left leg pain   BP 119/80 mmHg  Pulse 103  Temp(Src) 99.3 F (37.4 C) (Oral)  Resp 18  SpO2 100%  I have reviewed nursing notes and vital signs. I personally viewed the imaging tests through PACS system and agrees with radiologist's intepretation I reviewed available ER/hospitalization records through the EMR  I personally performed the services described in this documentation, which was scribed in my presence. The recorded information has been reviewed and is accurate.       Fayrene Helper, PA-C 12/14/14 1736  Azalia Bilis, MD 12/15/14 [phone]

## 2014-12-14 NOTE — ED Notes (Signed)
Ultrasound @ bedside. Will obtain VS when finished

## 2015-01-10 ENCOUNTER — Emergency Department (HOSPITAL_COMMUNITY)
Admission: EM | Admit: 2015-01-10 | Discharge: 2015-01-10 | Disposition: A | Discharge status: Home / Self Care | Payer: Medicaid Other | Attending: Emergency Medicine | Admitting: Emergency Medicine

## 2015-01-10 ENCOUNTER — Encounter (HOSPITAL_COMMUNITY): Payer: Self-pay | Admitting: Emergency Medicine

## 2015-01-10 DIAGNOSIS — Z880 Allergy status to penicillin: Secondary | ICD-10-CM | POA: Insufficient documentation

## 2015-01-10 DIAGNOSIS — K5900 Constipation, unspecified: Secondary | ICD-10-CM | POA: Insufficient documentation

## 2015-01-10 DIAGNOSIS — Z3202 Encounter for pregnancy test, result negative: Secondary | ICD-10-CM | POA: Insufficient documentation

## 2015-01-10 DIAGNOSIS — K625 Hemorrhage of anus and rectum: Secondary | ICD-10-CM | POA: Insufficient documentation

## 2015-01-10 DIAGNOSIS — I10 Essential (primary) hypertension: Secondary | ICD-10-CM | POA: Insufficient documentation

## 2015-01-10 DIAGNOSIS — Z720 Tobacco use: Secondary | ICD-10-CM | POA: Insufficient documentation

## 2015-01-10 LAB — CBC WITH DIFFERENTIAL/PLATELET
Basophils Absolute: 0 10*3/uL (ref 0.0–0.1)
Basophils Relative: 1 %
EOS PCT: 3 %
Eosinophils Absolute: 0.3 10*3/uL (ref 0.0–0.7)
HEMATOCRIT: 37.7 % (ref 36.0–46.0)
Hemoglobin: 12.7 g/dL (ref 12.0–15.0)
LYMPHS PCT: 44 %
Lymphs Abs: 3.5 10*3/uL (ref 0.7–4.0)
MCH: 28.3 pg (ref 26.0–34.0)
MCHC: 33.7 g/dL (ref 30.0–36.0)
MCV: 84.2 fL (ref 78.0–100.0)
MONO ABS: 0.4 10*3/uL (ref 0.1–1.0)
MONOS PCT: 5 %
NEUTROS ABS: 3.7 10*3/uL (ref 1.7–7.7)
Neutrophils Relative %: 47 %
PLATELETS: 297 10*3/uL (ref 150–400)
RBC: 4.48 MIL/uL (ref 3.87–5.11)
RDW: 11.8 % (ref 11.5–15.5)
WBC: 7.8 10*3/uL (ref 4.0–10.5)

## 2015-01-10 LAB — I-STAT CHEM 8, ED
BUN: 5 mg/dL — AB (ref 6–20)
CREATININE: 0.6 mg/dL (ref 0.44–1.00)
Calcium, Ion: 1.19 mmol/L (ref 1.12–1.23)
Chloride: 106 mmol/L (ref 101–111)
GLUCOSE: 87 mg/dL (ref 65–99)
HCT: 42 % (ref 36.0–46.0)
HEMOGLOBIN: 14.3 g/dL (ref 12.0–15.0)
POTASSIUM: 3.9 mmol/L (ref 3.5–5.1)
Sodium: 140 mmol/L (ref 135–145)
TCO2: 23 mmol/L (ref 0–100)

## 2015-01-10 LAB — POC OCCULT BLOOD, ED: Fecal Occult Bld: NEGATIVE

## 2015-01-10 LAB — POC URINE PREG, ED: Preg Test, Ur: NEGATIVE

## 2015-01-10 NOTE — ED Provider Notes (Signed)
CSN: [id number]     Arrival date & time 01/10/15  1043 History   First MD Initiated Contact with Patient 01/10/15 1109     Chief Complaint  Patient presents with  . Rectal Bleeding     (Consider location/radiation/quality/duration/timing/severity/associated sxs/prior Treatment) Patient is a 19 y.o. female presenting with hematochezia. The history is provided by the patient.  Rectal Bleeding Quality:  Bright red Associated symptoms: abdominal pain   Associated symptoms: no vomiting    patient states she's had blood going to the bathroom over the last 2 weeks. States she thought she had a boil she has had before and states it'll pop she'll have some pus and blood. States she has had blood with wiping but now has had some blood clots blood and stool with wiping after having a bowel movement. States the bowel movement has been both watery and constipation. States she feels little crampy abdomen. No lightheadedness or dizziness. No other bleeding. She is not on anticoagulation. States she does not feel as if she has a boil right now. She is not think she is more pale than normal.  Past Medical History  Diagnosis Date  . Constipation   . Seasonal allergies   . Hypertension    History reviewed. No pertinent past surgical history. Family History  Problem Relation Age of Onset  . Migraines Sister   . Hypertension Father    Social History  Substance Use Topics  . Smoking status: Current Every Day Smoker -- 0.50 packs/day    Types: Cigarettes  . Smokeless tobacco: None  . Alcohol Use: No   OB History    No data available     Review of Systems  Constitutional: Negative for activity change and appetite change.  Eyes: Negative for pain.  Respiratory: Negative for chest tightness and shortness of breath.   Cardiovascular: Negative for chest pain and leg swelling.  Gastrointestinal: Positive for abdominal pain, diarrhea, constipation, blood in stool and hematochezia. Negative for nausea  and vomiting.  Genitourinary: Negative for flank pain.  Musculoskeletal: Negative for back pain and neck stiffness.  Skin: Negative for rash.  Neurological: Negative for weakness, numbness and headaches.  Psychiatric/Behavioral: Negative for behavioral problems.      Allergies  Cephalosporins and Penicillins  Home Medications   Prior to Admission medications   Medication Sig Start Date End Date Taking? Authorizing Provider  ibuprofen (ADVIL,MOTRIN) 200 MG tablet Take 200 mg by mouth every 6 (six) hours as needed for fever or moderate pain.   Yes Historical Provider, MD  ondansetron (ZOFRAN) 8 MG tablet Take 1 tablet (8 mg total) by mouth every 8 (eight) hours as needed for nausea. Patient not taking: Reported on 01/10/2015 09/11/14   Doug Sou, MD   BP 121/80 mmHg  Pulse 91  Temp(Src) 98.6 F (37 C)  Resp 18  SpO2 100% Physical Exam  Constitutional: She is oriented to person, place, and time. She appears well-developed and well-nourished.  HENT:  Head: Normocephalic and atraumatic.  Neck: Normal range of motion.  Cardiovascular: Normal rate, regular rhythm and normal heart sounds.   No murmur heard. Pulmonary/Chest: Effort normal and breath sounds normal. No respiratory distress. She has no wheezes. She has no rales.  Abdominal: Soft. She exhibits no distension and no mass. There is no tenderness.  Musculoskeletal: Normal range of motion. She exhibits no edema.  Neurological: She is alert and oriented to person, place, and time. No cranial nerve deficit.  Skin: Skin is warm and dry. No  pallor.  Psychiatric: She has a normal mood and affect. Her speech is normal.  Nursing note and vitals reviewed.   ED Course  Procedures (including critical care time) Labs Review Labs Reviewed  I-STAT CHEM 8, ED - Abnormal; Notable for the following:    BUN 5 (*)    All other components within normal limits  CBC WITH DIFFERENTIAL/PLATELET  POC OCCULT BLOOD, ED  POC URINE PREG, ED     Imaging Review No results found. I have personally reviewed and evaluated these images and lab results as part of my medical decision-making.   EKG Interpretation None      MDM   Final diagnoses:  Rectal bleeding    Patient with rectal bleeding. No bleeding on rectal exam. Hemoglobin reassuring. Has had previous sounds like perianal abscess. No abscess seen. Will discharge home to follow-up with GI.    Benjiman Core, MD 01/12/15 [phone]

## 2015-01-10 NOTE — ED Notes (Signed)
MD at bedside. 

## 2015-01-10 NOTE — ED Notes (Signed)
Pt reports she had a "boil" on her anus a few weeks ago, reports she has been constipated recently and had been straining when having BM's, reports some bleeding for the past few weeks that she thought was from straining. Reports today she had more bleeding and "clots" after having a bowel movement. Pt reports the blood was bright red.

## 2015-01-10 NOTE — Discharge Instructions (Signed)

## 2015-01-10 NOTE — ED Notes (Signed)
Pt ambulated to the restroom with ease. 

## 2016-06-14 IMAGING — DX DG CERVICAL SPINE COMPLETE 4+V
6 series · 6 of 6 positions shown · non-contrast
Comparison: None.

CLINICAL DATA: Left posterior chest and scapular pain. Neck pain
for 2 days.

EXAM:
CERVICAL SPINE  4+ VIEWS

[c-spine lat]
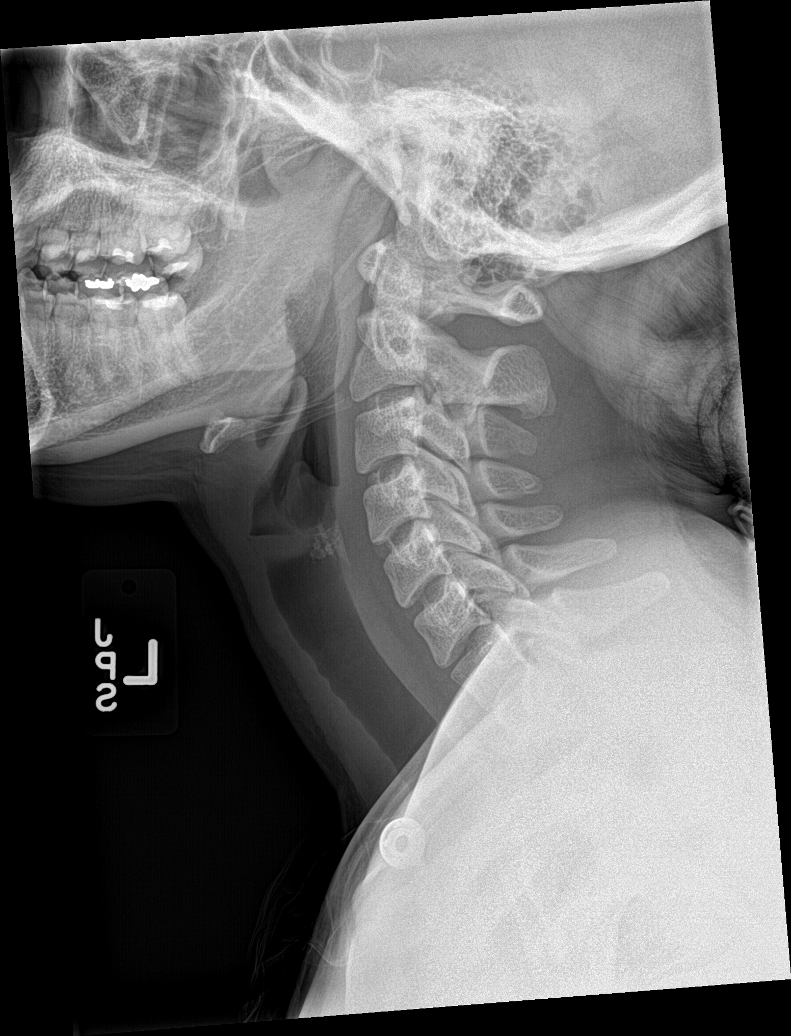

[c-spine obl (1 of 2)]
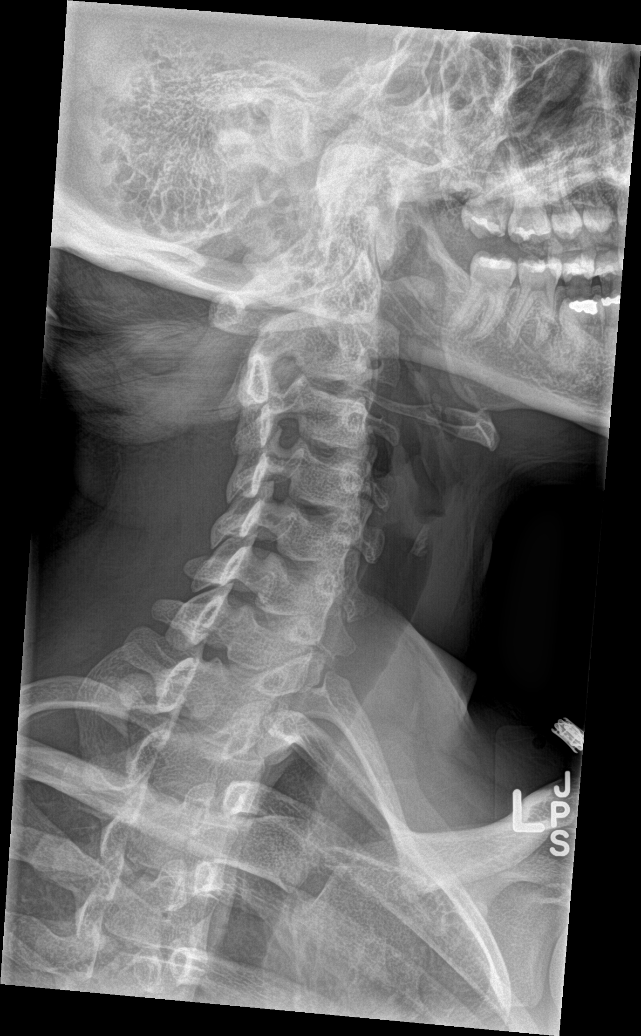

[c-spine obl (2 of 2)]
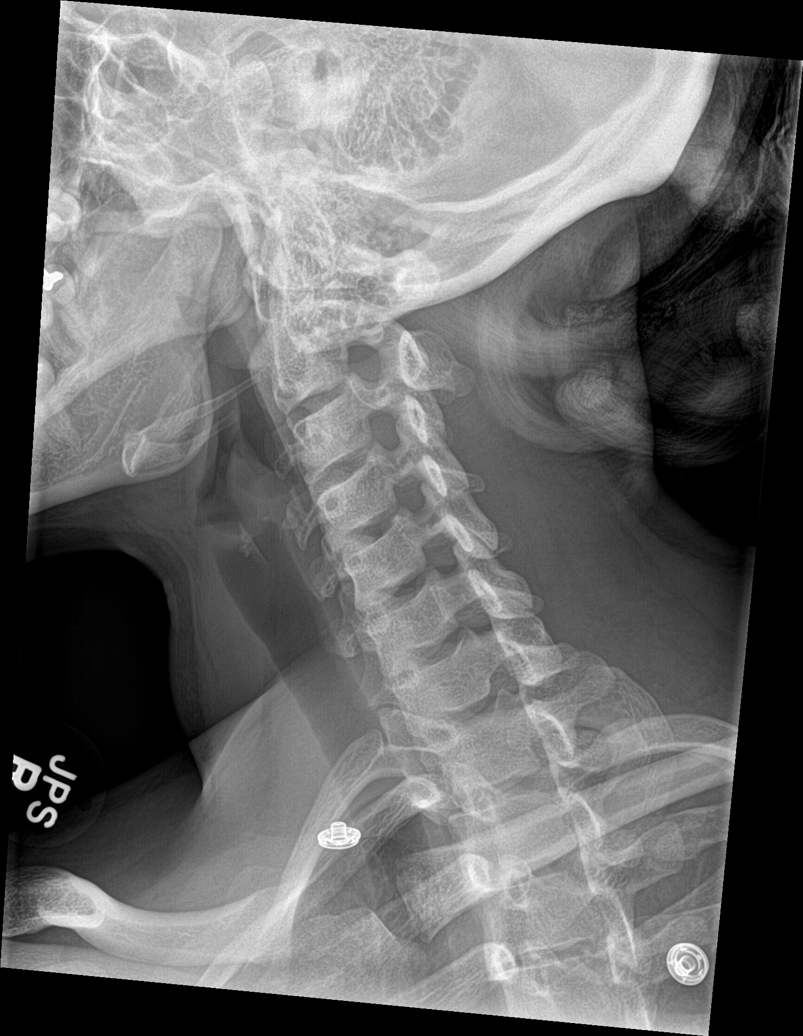

[c-spine ap]
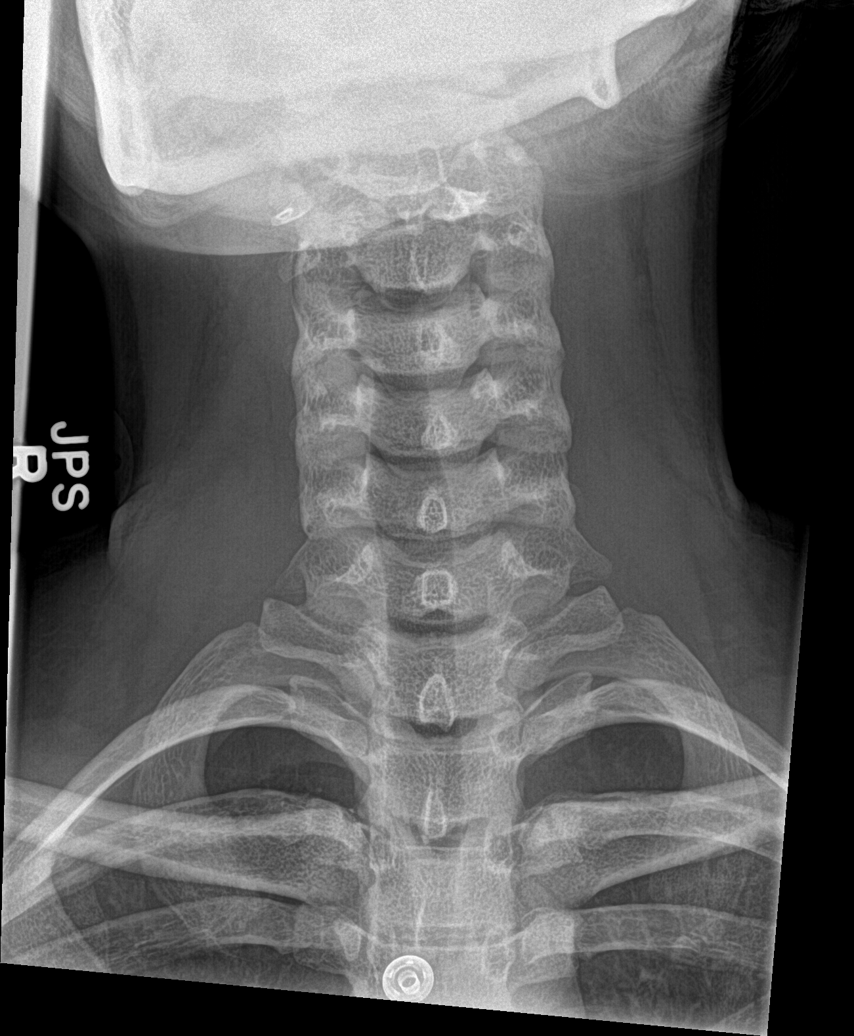

[c-spine open mouth]
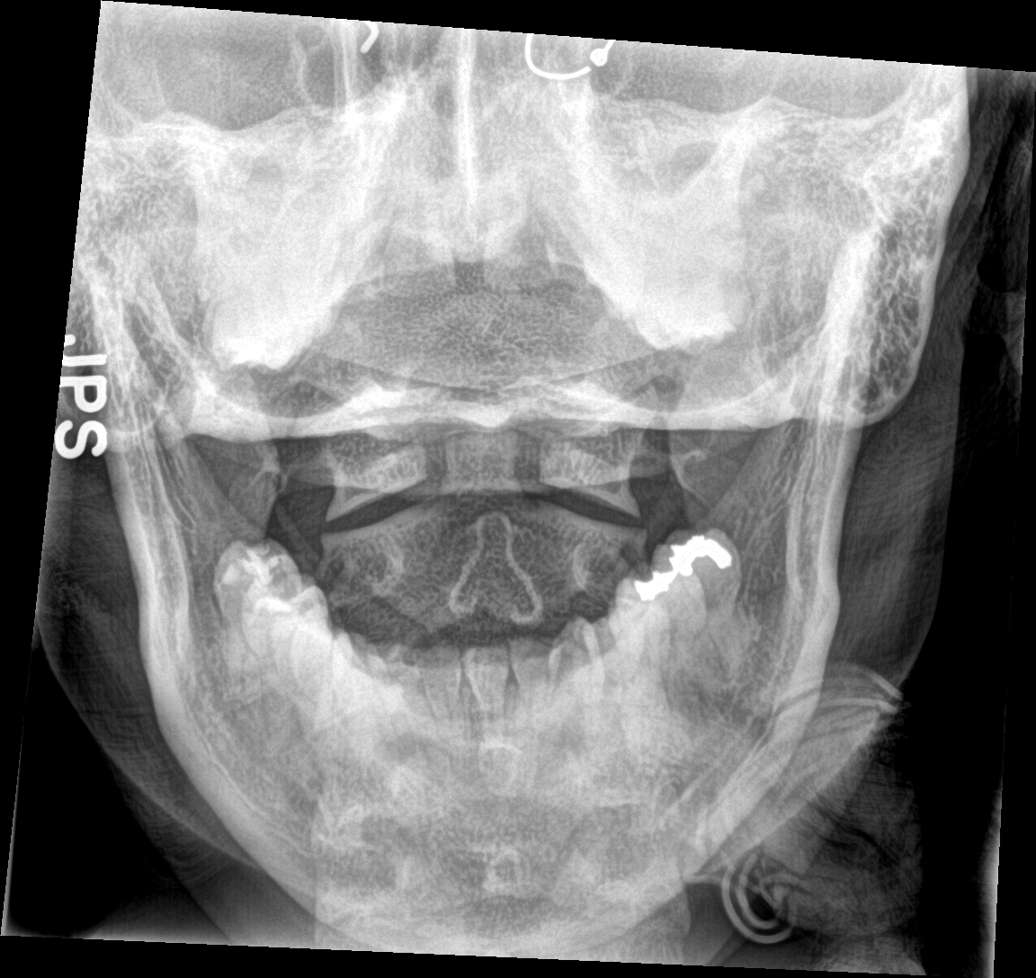

[c-spine swimmers]
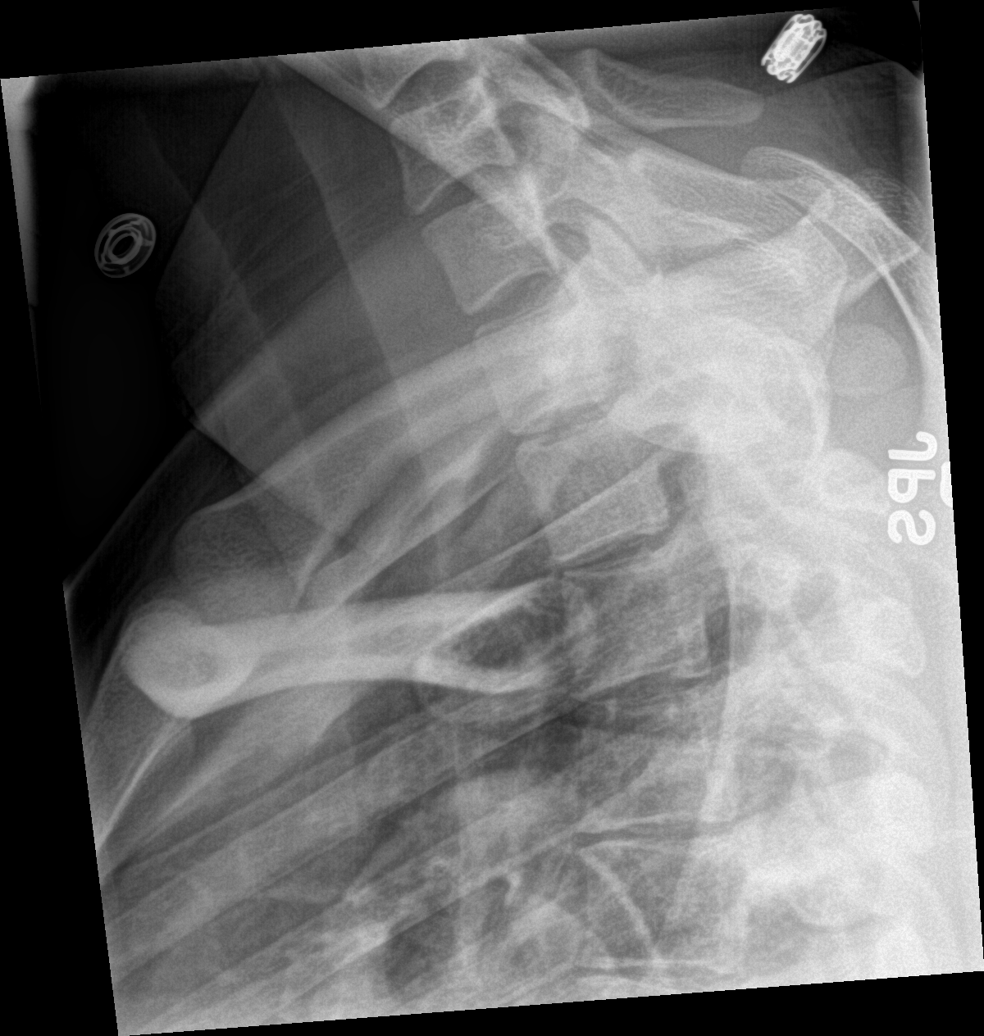

[6 of 6 positions shown; findings below may reference images not displayed]

FINDINGS: The lateral view images through bb the bottom of C7. Prevertebral
soft tissues are within normal limits. Maintenance of vertebral body
height and alignment. Intervertebral disc heights are maintained.
Lateral masses and odontoid process partially obscured. Grossly
within normal limits.
IMPRESSION: No acute osseous abnormality.

## 2016-06-23 ENCOUNTER — Encounter (HOSPITAL_COMMUNITY): Payer: Self-pay | Admitting: Emergency Medicine

## 2016-06-23 ENCOUNTER — Emergency Department (HOSPITAL_COMMUNITY)
Admission: EM | Admit: 2016-06-23 | Discharge: 2016-06-23 | Disposition: A | Discharge status: Home / Self Care | Payer: Medicaid Other | Attending: Emergency Medicine | Admitting: Emergency Medicine

## 2016-06-23 DIAGNOSIS — I10 Essential (primary) hypertension: Secondary | ICD-10-CM | POA: Insufficient documentation

## 2016-06-23 DIAGNOSIS — K529 Noninfective gastroenteritis and colitis, unspecified: Secondary | ICD-10-CM | POA: Insufficient documentation

## 2016-06-23 DIAGNOSIS — F17210 Nicotine dependence, cigarettes, uncomplicated: Secondary | ICD-10-CM | POA: Insufficient documentation

## 2016-06-23 DIAGNOSIS — Z79899 Other long term (current) drug therapy: Secondary | ICD-10-CM | POA: Insufficient documentation

## 2016-06-23 LAB — COMPREHENSIVE METABOLIC PANEL
ALBUMIN: 3.8 g/dL (ref 3.5–5.0)
ALT: 15 U/L (ref 14–54)
AST: 23 U/L (ref 15–41)
Alkaline Phosphatase: 80 U/L (ref 38–126)
Anion gap: 4 — ABNORMAL LOW (ref 5–15)
BILIRUBIN TOTAL: 0.4 mg/dL (ref 0.3–1.2)
BUN: 7 mg/dL (ref 6–20)
CO2: 24 mmol/L (ref 22–32)
Calcium: 8.7 mg/dL — ABNORMAL LOW (ref 8.9–10.3)
Chloride: 108 mmol/L (ref 101–111)
Creatinine, Ser: 0.72 mg/dL (ref 0.44–1.00)
GFR calc Af Amer: >60 mL/min (ref >60)
GFR calc non Af Amer: >60 mL/min (ref >60)
GLUCOSE: 92 mg/dL (ref 65–99)
POTASSIUM: 3.6 mmol/L (ref 3.5–5.1)
SODIUM: 136 mmol/L (ref 135–145)
TOTAL PROTEIN: 7 g/dL (ref 6.5–8.1)

## 2016-06-23 LAB — URINALYSIS, ROUTINE W REFLEX MICROSCOPIC
Bilirubin Urine: NEGATIVE
GLUCOSE, UA: NEGATIVE mg/dL
Hgb urine dipstick: NEGATIVE
Ketones, ur: NEGATIVE mg/dL
LEUKOCYTES UA: NEGATIVE
Nitrite: NEGATIVE
PROTEIN: NEGATIVE mg/dL
Specific Gravity, Urine: 1.016 (ref 1.005–1.030)
pH: 5 (ref 5.0–8.0)

## 2016-06-23 LAB — CBC
HEMATOCRIT: 34.8 % — AB (ref 36.0–46.0)
HEMOGLOBIN: 11.8 g/dL — AB (ref 12.0–15.0)
MCH: 28 pg (ref 26.0–34.0)
MCHC: 33.9 g/dL (ref 30.0–36.0)
MCV: 82.5 fL (ref 78.0–100.0)
Platelets: 271 10*3/uL (ref 150–400)
RBC: 4.22 MIL/uL (ref 3.87–5.11)
RDW: 11.7 % (ref 11.5–15.5)
WBC: 6 10*3/uL (ref 4.0–10.5)

## 2016-06-23 LAB — LIPASE, BLOOD: Lipase: 14 U/L (ref 11–51)

## 2016-06-23 LAB — I-STAT BETA HCG BLOOD, ED (MC, WL, AP ONLY): I-stat hCG, quantitative: <5 m[IU]/mL (ref <5)

## 2016-06-23 MED ORDER — ONDANSETRON 8 MG PO TBDP: 8.0000 mg | ORAL_TABLET | Freq: Three times a day (TID) | ORAL | 0 refills | Status: DC | PRN

## 2016-06-23 MED ORDER — ONDANSETRON HCL 4 MG/2ML IJ SOLN
4.0000 mg | Freq: Once | INTRAMUSCULAR | Status: AC
  Administered 2016-06-23: 4 mg via INTRAVENOUS
  Filled 2016-06-23: qty 2

## 2016-06-23 MED ORDER — SODIUM CHLORIDE 0.9 % IV BOLUS (SEPSIS)
1000.0000 mL | Freq: Once | INTRAVENOUS | Status: AC
  Administered 2016-06-23: 1000 mL via INTRAVENOUS

## 2016-06-23 NOTE — ED Provider Notes (Signed)
[facility]-EMERGENCY DEPT Provider Note   CSN: [id number] Arrival date & time: 06/23/16  0831     History   Chief Complaint Chief Complaint  Patient presents with  . Diarrhea  . Vomiting    HPI Donna Mcclain is a 21 y.o. female.   Pt first started having trouble with vomiting and diarrhea a few days ago.  She first started having a poor appetite and some fatigue a few days ago.  She thought she could be constipated so she a laxative.  After that she started having vomiting and diarrhea.  She vomited  Around 4 times but had over 10 episodes of diarrhea.    No blood noted.  The diarrhea is very watery.  No fevers.  Some chills.   No vaginal bleeding.  No discharge.  No dysuria. No foreign travel.  No recent abx.  Past Medical History:  Diagnosis Date  . Constipation   . Hypertension   . Seasonal allergies     Patient Active Problem List   Diagnosis Date Noted  . Migraine without aura and without status migrainosus, not intractable 07/09/2012  . Tension headache 07/09/2012  . Acute abdominal pain 03/24/2011  . ERYTHRASMA 05/03/2010  . BREAST PAIN, BILATERAL 05/03/2010  . HIDRADENITIS SUPPURATIVA 02/08/2009    History reviewed. No pertinent surgical history.  OB History    No data available       Home Medications    Prior to Admission medications   Medication Sig Start Date End Date Taking? Authorizing Provider  ibuprofen (ADVIL,MOTRIN) 200 MG tablet Take 200 mg by mouth every 6 (six) hours as needed for fever or moderate pain.    Historical Provider, MD  ondansetron (ZOFRAN ODT) 8 MG disintegrating tablet Take 1 tablet (8 mg total) by mouth every 8 (eight) hours as needed for nausea or vomiting. 06/23/16   Linwood DibblesJon Ahtziri Jeffries, MD  ondansetron (ZOFRAN) 8 MG tablet Take 1 tablet (8 mg total) by mouth every 8 (eight) hours as needed for nausea. Patient not taking: Reported on 01/10/2015 09/11/14   Doug SouSam Jacubowitz, MD    Family History Family History  Problem Relation Age of Onset   . Migraines Sister   . Hypertension Father     Social History Social History  Substance Use Topics  . Smoking status: Current Every Day Smoker    Packs/day: 0.50    Types: Cigarettes  . Smokeless tobacco: Not on file  . Alcohol use No     Allergies   Cephalosporins and Penicillins   Review of Systems Review of Systems  All other systems reviewed and are negative.    Physical Exam Updated Vital Signs BP 127/83 (BP Location: Left Arm)   Pulse 113   Temp 98.9 F (37.2 C) (Oral)   Resp 16   SpO2 99%   Physical Exam  Constitutional: She appears well-developed and well-nourished. No distress.  HENT:  Head: Normocephalic and atraumatic.  Right Ear: External ear normal.  Left Ear: External ear normal.  Eyes: Conjunctivae are normal. Right eye exhibits no discharge. Left eye exhibits no discharge. No scleral icterus.  Neck: Neck supple. No tracheal deviation present.  Cardiovascular: Regular rhythm and intact distal pulses.  Tachycardia present.   Pulmonary/Chest: Effort normal and breath sounds normal. No stridor. No respiratory distress. She has no wheezes. She has no rales.  Abdominal: Soft. Bowel sounds are normal. She exhibits no distension. There is no tenderness. There is no rebound and no guarding.  Musculoskeletal: She exhibits no edema  or tenderness.  Neurological: She is alert. She has normal strength. No cranial nerve deficit (no facial droop, extraocular movements intact, no slurred speech) or sensory deficit. She exhibits normal muscle tone. She displays no seizure activity. Coordination normal.  Skin: Skin is warm and dry. No rash noted.  Psychiatric: She has a normal mood and affect.  Nursing note and vitals reviewed.    ED Treatments / Results  Labs (all labs ordered are listed, but only abnormal results are displayed) Labs Reviewed  COMPREHENSIVE METABOLIC PANEL - Abnormal; Notable for the following:       Result Value   Calcium 8.7 (*)    Anion  gap 4 (*)    All other components within normal limits  CBC - Abnormal; Notable for the following:    Hemoglobin 11.8 (*)    HCT 34.8 (*)    All other components within normal limits  URINALYSIS, ROUTINE W REFLEX MICROSCOPIC - Abnormal; Notable for the following:    APPearance CLOUDY (*)    All other components within normal limits  LIPASE, BLOOD  I-STAT BETA HCG BLOOD, ED ([facility], [facility], [facility] ONLY)    Procedures Procedures (including critical care time)  Medications Ordered in ED Medications  sodium chloride 0.9 % bolus 1,000 mL (1,000 mLs Intravenous New Bag/Given 06/23/16 1040)  ondansetron (ZOFRAN) injection 4 mg (4 mg Intravenous Given 06/23/16 1043)     Initial Impression / Assessment and Plan / ED Course  I have reviewed the triage vital signs and the nursing notes.  Pertinent labs & imaging results that were available during my care of the patient were reviewed by me and considered in my medical decision making (see chart for details).   patient presented to the emergency room with complaints of vomiting and diarrhea. Laboratory tests are reassuring. I suspect her symptoms are related to a viral gastroenteritis.  He was treated with IV fluids and antinausea medications. Her symptoms have improved. Patient is ready for discharge  Final Clinical Impressions(s) / ED Diagnoses   Final diagnoses:  Gastroenteritis    New Prescriptions New Prescriptions   ONDANSETRON (ZOFRAN ODT) 8 MG DISINTEGRATING TABLET    Take 1 tablet (8 mg total) by mouth every 8 (eight) hours as needed for nausea or vomiting.     Linwood Dibbles, MD 06/23/16 [phone]

## 2016-06-23 NOTE — ED Triage Notes (Signed)
Pt sts N/V/D and body aches x 3 days

## 2016-06-23 NOTE — Discharge Instructions (Signed)
Return to the emergency room as needed for worsening symptoms fever, abdominal pain

## 2016-08-20 ENCOUNTER — Emergency Department (HOSPITAL_COMMUNITY)
Admission: EM | Admit: 2016-08-20 | Discharge: 2016-08-20 | Disposition: A | Discharge status: Home / Self Care | Payer: Medicaid Other | Attending: Emergency Medicine | Admitting: Emergency Medicine

## 2016-08-20 ENCOUNTER — Encounter (HOSPITAL_COMMUNITY): Payer: Self-pay

## 2016-08-20 DIAGNOSIS — I10 Essential (primary) hypertension: Secondary | ICD-10-CM | POA: Insufficient documentation

## 2016-08-20 DIAGNOSIS — R112 Nausea with vomiting, unspecified: Secondary | ICD-10-CM | POA: Insufficient documentation

## 2016-08-20 DIAGNOSIS — Z79899 Other long term (current) drug therapy: Secondary | ICD-10-CM | POA: Insufficient documentation

## 2016-08-20 DIAGNOSIS — R197 Diarrhea, unspecified: Secondary | ICD-10-CM | POA: Insufficient documentation

## 2016-08-20 DIAGNOSIS — F17210 Nicotine dependence, cigarettes, uncomplicated: Secondary | ICD-10-CM | POA: Insufficient documentation

## 2016-08-20 LAB — URINALYSIS, ROUTINE W REFLEX MICROSCOPIC
Bilirubin Urine: NEGATIVE
GLUCOSE, UA: NEGATIVE mg/dL
HGB URINE DIPSTICK: NEGATIVE
Ketones, ur: 5 mg/dL — AB
LEUKOCYTES UA: NEGATIVE
Nitrite: NEGATIVE
Protein, ur: NEGATIVE mg/dL
SPECIFIC GRAVITY, URINE: 1.015 (ref 1.005–1.030)
pH: 5 (ref 5.0–8.0)

## 2016-08-20 LAB — COMPREHENSIVE METABOLIC PANEL
ALT: 15 U/L (ref 14–54)
ANION GAP: 8 (ref 5–15)
AST: 22 U/L (ref 15–41)
Albumin: 4.4 g/dL (ref 3.5–5.0)
Alkaline Phosphatase: 84 U/L (ref 38–126)
BUN: 7 mg/dL (ref 6–20)
CHLORIDE: 107 mmol/L (ref 101–111)
CO2: 23 mmol/L (ref 22–32)
Calcium: 9.4 mg/dL (ref 8.9–10.3)
Creatinine, Ser: 0.72 mg/dL (ref 0.44–1.00)
GFR calc non Af Amer: >60 mL/min (ref >60)
Glucose, Bld: 90 mg/dL (ref 65–99)
POTASSIUM: 3.6 mmol/L (ref 3.5–5.1)
SODIUM: 138 mmol/L (ref 135–145)
Total Bilirubin: 0.5 mg/dL (ref 0.3–1.2)
Total Protein: 7.8 g/dL (ref 6.5–8.1)

## 2016-08-20 LAB — LIPASE, BLOOD: LIPASE: 16 U/L (ref 11–51)

## 2016-08-20 LAB — CBC
HEMATOCRIT: 37.6 % (ref 36.0–46.0)
HEMOGLOBIN: 12.5 g/dL (ref 12.0–15.0)
MCH: 28.1 pg (ref 26.0–34.0)
MCHC: 33.2 g/dL (ref 30.0–36.0)
MCV: 84.5 fL (ref 78.0–100.0)
Platelets: 307 10*3/uL (ref 150–400)
RBC: 4.45 MIL/uL (ref 3.87–5.11)
RDW: 11.9 % (ref 11.5–15.5)
WBC: 8.1 10*3/uL (ref 4.0–10.5)

## 2016-08-20 LAB — I-STAT BETA HCG BLOOD, ED (MC, WL, AP ONLY)

## 2016-08-20 MED ORDER — ONDANSETRON HCL 4 MG PO TABS: 4.0000 mg | ORAL_TABLET | Freq: Three times a day (TID) | ORAL | 0 refills | Status: DC | PRN

## 2016-08-20 MED ORDER — MORPHINE SULFATE (PF) 4 MG/ML IV SOLN
4.0000 mg | Freq: Once | INTRAVENOUS | Status: AC
  Administered 2016-08-20: 4 mg via INTRAVENOUS
  Filled 2016-08-20: qty 1

## 2016-08-20 MED ORDER — SODIUM CHLORIDE 0.9 % IV BOLUS (SEPSIS)
1000.0000 mL | Freq: Once | INTRAVENOUS | Status: AC
  Administered 2016-08-20: 1000 mL via INTRAVENOUS

## 2016-08-20 MED ORDER — ONDANSETRON HCL 4 MG/2ML IJ SOLN
4.0000 mg | Freq: Once | INTRAMUSCULAR | Status: AC
  Administered 2016-08-20: 4 mg via INTRAVENOUS
  Filled 2016-08-20: qty 2

## 2016-08-20 NOTE — ED Provider Notes (Signed)
[facility]-EMERGENCY DEPT Provider Note   CSN: [id number] Arrival date & time: 08/20/16  1044     History   Chief Complaint Chief Complaint  Patient presents with  . Abdominal Pain  . Emesis  . Diarrhea    HPI Donna Mcclain is a 21 y.o. female.  The history is provided by the patient. No language interpreter was used.  Abdominal Pain   Associated symptoms include diarrhea and vomiting.  Emesis   Associated symptoms include abdominal pain and diarrhea.  Diarrhea   Associated symptoms include abdominal pain and vomiting.    Donna Mcclain is a 21 y.o. female who presents to the Emergency Department complaining of N/V/D.  She reports 4 days of generalized abdominal pain described as abdominal cramping. She reports associated vomiting 3 or more times daily with diarrhea. No reports of fevers, dysuria, vaginal discharge. She has been in contact with her niece who has similar symptoms but [name] symptoms are lasting for longer.  Sxs are moderate, constant, worsening.    Past Medical History:  Diagnosis Date  . Constipation   . Hypertension   . Seasonal allergies     Patient Active Problem List   Diagnosis Date Noted  . Migraine without aura and without status migrainosus, not intractable 07/09/2012  . Tension headache 07/09/2012  . Acute abdominal pain 03/24/2011  . ERYTHRASMA 05/03/2010  . BREAST PAIN, BILATERAL 05/03/2010  . HIDRADENITIS SUPPURATIVA 02/08/2009    History reviewed. No pertinent surgical history.  OB History    No data available       Home Medications    Prior to Admission medications   Medication Sig Start Date End Date Taking? Authorizing Provider  ibuprofen (ADVIL,MOTRIN) 200 MG tablet Take 200 mg by mouth every 6 (six) hours as needed for fever or moderate pain.   Yes Historical Provider, MD  MedroxyPROGESTERone Acetate 150 MG/ML SUSY Inject 150 mg into the muscle. 01/01/16  Yes Historical Provider, MD  ondansetron (ZOFRAN ODT) 8 MG  disintegrating tablet Take 1 tablet (8 mg total) by mouth every 8 (eight) hours as needed for nausea or vomiting. Patient not taking: Reported on 08/20/2016 06/23/16   Linwood Dibbles, MD  ondansetron (ZOFRAN) 4 MG tablet Take 1 tablet (4 mg total) by mouth every 8 (eight) hours as needed for nausea or vomiting. 08/20/16   Tilden Fossa, MD    Family History Family History  Problem Relation Age of Onset  . Hypertension Father   . Migraines Sister     Social History Social History  Substance Use Topics  . Smoking status: Current Every Day Smoker    Packs/day: 1.00    Types: Cigarettes  . Smokeless tobacco: Never Used  . Alcohol use No     Allergies   Cephalosporins and Penicillins   Review of Systems Review of Systems  Gastrointestinal: Positive for abdominal pain, diarrhea and vomiting.  All other systems reviewed and are negative.    Physical Exam Updated Vital Signs BP 130/81   Pulse 78   Temp 98.5 F (36.9 C) (Oral)   Resp 16   Ht  (1.575 m)   Wt 182 lb 6 oz (82.7 kg)   LMP 08/19/2016   SpO2 100%   BMI 33.36 kg/m   Physical Exam  Constitutional: She is oriented to person, place, and time. She appears well-developed and well-nourished.  HENT:  Head: Normocephalic and atraumatic.  Cardiovascular: Normal rate and [id number].   No murmur heard. Pulmonary/Chest: Effort normal  and breath sounds normal. No respiratory distress.  Abdominal: Soft. There is no rebound and no guarding.  Moderate diffuse abdominal tenderness  Musculoskeletal: She exhibits no edema or tenderness.  Neurological: She is alert and oriented to person, place, and time.  Skin: Skin is warm and dry.  Psychiatric: She has a normal mood and affect. Her behavior is normal.  Nursing note and vitals reviewed.    ED Treatments / Results  Labs (all labs ordered are listed, but only abnormal results are displayed) Labs Reviewed  URINALYSIS, ROUTINE W REFLEX MICROSCOPIC - Abnormal; Notable  for the following:       Result Value   Ketones, ur 5 (*)    All other components within normal limits  LIPASE, BLOOD  COMPREHENSIVE METABOLIC PANEL  CBC  I-STAT BETA HCG BLOOD, ED ([facility], [facility], [facility] ONLY)    EKG  EKG Interpretation None       Radiology No results found.  Procedures Procedures (including critical care time)  Medications Ordered in ED Medications  sodium chloride 0.9 % bolus 1,000 mL (0 mLs Intravenous Stopped 08/20/16 1343)  ondansetron (ZOFRAN) injection 4 mg (4 mg Intravenous Given 08/20/16 1156)  morphine 4 MG/ML injection 4 mg (4 mg Intravenous Given 08/20/16 1157)     Initial Impression / Assessment and Plan / ED Course  I have reviewed the triage vital signs and the nursing notes.  Pertinent labs & imaging results that were available during my care of the patient were reviewed by me and considered in my medical decision making (see chart for details).     Pt here for evaluation of N/V/D/abdominal pain.  On repeat assessment she is feeling improved with resolution in her abdominal pain.  Current clinical picture is not c/w acute appendicitis, cholecystitis, pancreatitis.  Counseled pt on home care, outpatient follow up and return precautions.    Final Clinical Impressions(s) / ED Diagnoses   Final diagnoses:  Nausea vomiting and diarrhea    New Prescriptions Discharge Medication List as of 08/20/2016  1:36 PM       Tilden Fossa, MD 08/20/16 1704

## 2016-08-20 NOTE — ED Triage Notes (Signed)
Patient c/o abdominal pain, vomiting diarrhea x 4 days.

## 2016-09-26 ENCOUNTER — Encounter (HOSPITAL_COMMUNITY): Payer: Self-pay | Admitting: Neurology

## 2016-09-26 ENCOUNTER — Emergency Department (HOSPITAL_COMMUNITY)
Admission: EM | Admit: 2016-09-26 | Discharge: 2016-09-26 | Disposition: A | Discharge status: Home / Self Care | Payer: Medicaid Other | Attending: Emergency Medicine | Admitting: Emergency Medicine

## 2016-09-26 DIAGNOSIS — Z79899 Other long term (current) drug therapy: Secondary | ICD-10-CM | POA: Insufficient documentation

## 2016-09-26 DIAGNOSIS — F17210 Nicotine dependence, cigarettes, uncomplicated: Secondary | ICD-10-CM | POA: Insufficient documentation

## 2016-09-26 DIAGNOSIS — N644 Mastodynia: Secondary | ICD-10-CM

## 2016-09-26 DIAGNOSIS — I10 Essential (primary) hypertension: Secondary | ICD-10-CM | POA: Insufficient documentation

## 2016-09-26 MED ORDER — MELOXICAM 15 MG PO TABS: 15.0000 mg | ORAL_TABLET | Freq: Every day | ORAL | 0 refills | Status: DC

## 2016-09-26 NOTE — ED Triage Notes (Signed)
Pt c/o bilateral breast pain and nipple pain. Are very sore to touch for several days. Has had a cyst before and wants to make sure there isn't another one.

## 2016-09-26 NOTE — Discharge Instructions (Signed)
Take Meloxicam once daily. You can take Tylenol with this as well Apply warm compresses to area [facility]

## 2016-09-26 NOTE — ED Provider Notes (Signed)
[facility]-EMERGENCY DEPT Provider Note   CSN: [id number] Arrival date & time: 09/26/16  7829     History   Chief Complaint Chief Complaint  Patient presents with  . Breast Pain    HPI Donna Mcclain is a 21 y.o. female who presents with bilateral breast pain. Past medical history significant for fibrocystic breasts. The pain started about a week ago. It is worse in her right breast than her left. The pain is diffuse and aching. She denies any fever, redness or swelling, nipple discharge. She has tried ibuprofen and supportive bras with no relief. She states she previously went to the breast clinic however this was years ago when her pediatrician had referrred her and she states she does not have a primary care doctor at this time. She is on Depo-Provera and has been on this for years. She states she is not currently sexually active. Her last menstrual period was a month ago.  HPI  Past Medical History:  Diagnosis Date  . Constipation   . Hypertension   . Seasonal allergies     Patient Active Problem List   Diagnosis Date Noted  . Migraine without aura and without status migrainosus, not intractable 07/09/2012  . Tension headache 07/09/2012  . Acute abdominal pain 03/24/2011  . ERYTHRASMA 05/03/2010  . BREAST PAIN, BILATERAL 05/03/2010  . HIDRADENITIS SUPPURATIVA 02/08/2009    History reviewed. No pertinent surgical history.  OB History    No data available       Home Medications    Prior to Admission medications   Medication Sig Start Date End Date Taking? Authorizing Provider  ibuprofen (ADVIL,MOTRIN) 200 MG tablet Take 200 mg by mouth every 6 (six) hours as needed for fever or moderate pain.   Yes [provider]  MedroxyPROGESTERone Acetate 150 MG/ML SUSY Inject 150 mg into the muscle. 01/01/16   [provider]  ondansetron (ZOFRAN ODT) 8 MG disintegrating tablet Take 1 tablet (8 mg total) by mouth every 8 (eight) hours as needed for nausea or  vomiting. Patient not taking: Reported on 08/20/2016 06/23/16   Linwood Dibbles, MD  ondansetron (ZOFRAN) 4 MG tablet Take 1 tablet (4 mg total) by mouth every 8 (eight) hours as needed for nausea or vomiting. Patient not taking: Reported on 09/26/2016 08/20/16   Tilden Fossa, MD    Family History Family History  Problem Relation Age of Onset  . Hypertension Father   . Migraines Sister     Social History Social History  Substance Use Topics  . Smoking status: Current Every Day Smoker    Packs/day: 1.00    Types: Cigarettes  . Smokeless tobacco: Never Used  . Alcohol use No     Allergies   Cephalosporins and Penicillins   Review of Systems Review of Systems  Constitutional: Negative for fever.  Genitourinary:       +breast pain  Skin: Negative for color change.  All other systems reviewed and are negative.    Physical Exam Updated Vital Signs BP (!) 148/91 (BP Location: Left Arm)   Pulse 98   Temp 98.3 F (36.8 C) (Oral)   Resp 16   Ht 5\' 2"  (1.575 m)   Wt 83.9 kg (185 lb)   LMP 08/28/2016   SpO2 94%   BMI 33.84 kg/m   Physical Exam  Constitutional: She is oriented to person, place, and time. She appears well-developed and well-nourished. No distress.  HENT:  Head: Normocephalic and atraumatic.  Eyes: Conjunctivae are normal.  Pupils are equal, round, and reactive to light. Right eye exhibits no discharge. Left eye exhibits no discharge. No scleral icterus.  Neck: Normal range of motion.  Cardiovascular: Normal rate.   Pulmonary/Chest: Effort normal. No respiratory distress. Right breast exhibits tenderness (diffuse with fibrous, dense breast tissue). Right breast exhibits no inverted nipple, no mass, no nipple discharge and no skin change. Left breast exhibits tenderness (diffuse). Left breast exhibits no inverted nipple, no mass, no nipple discharge and no skin change. There is no breast swelling.  Abdominal: She exhibits no distension.  Neurological: She is alert  and oriented to person, place, and time.  Skin: Skin is warm and dry.  Psychiatric: She has a normal mood and affect. Her behavior is normal.  Nursing note and vitals reviewed.    ED Treatments / Results  Labs (all labs ordered are listed, but only abnormal results are displayed) Labs Reviewed - No data to display  EKG  EKG Interpretation None       Radiology No results found.  Procedures Procedures (including critical care time)  Medications Ordered in ED Medications - No data to display   Initial Impression / Assessment and Plan / ED Course  I have reviewed the triage vital signs and the nursing notes.  Pertinent labs & imaging results that were available during my care of the patient were reviewed by me and considered in my medical decision making (see chart for details).  21 year old female with bilateral breast pain and history of fibrocystic breast disease. She's been trying conservative management without relief. No evidence of abscess on exam. Prescription for meloxicam given and advised warm compresses. Advised follow up with either [facility]. Return precautions given.  Final Clinical Impressions(s) / ED Diagnoses   Final diagnoses:  Pain of both breasts    New Prescriptions New Prescriptions   No medications on file     Beryle QuantGekas, Tawney Vanorman Marie, PA-C 09/26/16 1129    Linwood DibblesKnapp, Jon, MD 09/27/16 [phone]

## 2016-12-04 ENCOUNTER — Encounter (HOSPITAL_COMMUNITY): Payer: Self-pay

## 2016-12-04 ENCOUNTER — Emergency Department (HOSPITAL_COMMUNITY)
Admission: EM | Admit: 2016-12-04 | Discharge: 2016-12-05 | Disposition: A | Discharge status: Home / Self Care | Payer: Medicaid Other | Attending: Emergency Medicine | Admitting: Emergency Medicine

## 2016-12-04 DIAGNOSIS — F17210 Nicotine dependence, cigarettes, uncomplicated: Secondary | ICD-10-CM | POA: Insufficient documentation

## 2016-12-04 DIAGNOSIS — R519 Headache, unspecified: Secondary | ICD-10-CM

## 2016-12-04 DIAGNOSIS — R51 Headache: Secondary | ICD-10-CM | POA: Insufficient documentation

## 2016-12-04 DIAGNOSIS — I10 Essential (primary) hypertension: Secondary | ICD-10-CM | POA: Insufficient documentation

## 2016-12-04 DIAGNOSIS — Z79899 Other long term (current) drug therapy: Secondary | ICD-10-CM | POA: Insufficient documentation

## 2016-12-04 MED ORDER — METOCLOPRAMIDE HCL 5 MG/ML IJ SOLN
10.0000 mg | Freq: Once | INTRAMUSCULAR | Status: AC
  Administered 2016-12-04: 10 mg via INTRAVENOUS
  Filled 2016-12-04: qty 2

## 2016-12-04 MED ORDER — SODIUM CHLORIDE 0.9 % IV BOLUS (SEPSIS)
1000.0000 mL | Freq: Once | INTRAVENOUS | Status: AC
  Administered 2016-12-04: 1000 mL via INTRAVENOUS

## 2016-12-04 MED ORDER — DIPHENHYDRAMINE HCL 50 MG/ML IJ SOLN
25.0000 mg | Freq: Once | INTRAMUSCULAR | Status: AC
  Administered 2016-12-04: 25 mg via INTRAVENOUS
  Filled 2016-12-04: qty 1

## 2016-12-04 MED ORDER — KETOROLAC TROMETHAMINE 30 MG/ML IJ SOLN
30.0000 mg | Freq: Once | INTRAMUSCULAR | Status: AC
  Administered 2016-12-04: 30 mg via INTRAVENOUS
  Filled 2016-12-04: qty 1

## 2016-12-04 NOTE — Discharge Instructions (Signed)
Please read and follow all provided instructions.  Your diagnoses today include:  1. Acute nonintractable headache, unspecified headache type     Tests performed today include: Vital signs. See below for your results today.   Medications:  In the Emergency Department you received: Reglan - antinausea/headache medication Benadryl - antihistamine to counteract potential side effects of reglan Toradol - NSAID medication similar to ibuprofen  Take any prescribed medications only as directed.  Additional information:  Follow any educational materials contained in this packet.  You are having a headache. No specific cause was found today for your headache. It may have been a migraine or other cause of headache. Stress, anxiety, fatigue, and depression are common triggers for headaches.   Your headache today does not appear to be life-threatening or require hospitalization, but often the exact cause of headaches is not determined in the emergency department. Therefore, follow-up with your doctor is very important to find out what may have caused your headache and whether or not you need any further diagnostic testing or treatment.   Sometimes headaches can appear benign (not harmful), but then more serious symptoms can develop which should prompt an immediate re-evaluation by your doctor or the emergency department.  BE VERY CAREFUL not to take multiple medicines containing Tylenol (also called acetaminophen). Doing so can lead to an overdose which can damage your liver and cause liver failure and possibly death.   Follow-up instructions: Please follow-up with your primary care provider in the next 3 days for further evaluation of your symptoms.   Return instructions:  Please return to the Emergency Department if you experience worsening symptoms. Return if the medications do not resolve your headache, if it recurs, or if you have multiple episodes of vomiting or cannot keep down  fluids. Return if you have a change from the usual headache. RETURN IMMEDIATELY IF you: Develop a sudden, severe headache Develop confusion or become poorly responsive or faint Develop a fever above 100.23F or problem breathing Have a change in speech, vision, swallowing, or understanding Develop new weakness, numbness, tingling, incoordination in your arms or legs Have a seizure Please return if you have any other emergent concerns.  Additional Information:  Your vital signs today were: BP 134/86 (BP Location: Left Arm)    Pulse (!) 109    Temp 99.9 F (37.7 C) (Oral)    Resp 18    LMP 11/19/2016 (Approximate)    SpO2 100%  If your blood pressure (BP) was elevated above 135/85 this visit, please have this repeated by your doctor within one month. --------------

## 2016-12-04 NOTE — ED Provider Notes (Signed)
[facility]-EMERGENCY DEPT Provider Note   CSN: [id number] Arrival date & time: 12/04/16  1832     History   Chief Complaint Chief Complaint  Patient presents with  . Headache    HPI Donna Mcclain is a 21 y.o. female.  HPI  21 y.o. female with a hx of HTN, presents to the Emergency Department today due to frontal headache with onset today. Notes throbbing sensation and rates 10/10. Gradual in onset. No N/V. No numbness/tingling. No vision changes. Noted hx same. States increased stress at home that caused elevated BP. Pt used to be on medication, but has stopped due to PCP change. Controlling with diet. Denies CP/SOB/ABD Mcclain. No fevers. No cough or URI symptoms. No neck stiffness. No meds PTA. No other symptoms noted  Past Medical History:  Diagnosis Date  . Constipation   . Hypertension   . Seasonal allergies     Patient Active Problem List   Diagnosis Date Noted  . Migraine without aura and without status migrainosus, not intractable 07/09/2012  . Tension headache 07/09/2012  . Acute abdominal Mcclain 03/24/2011  . ERYTHRASMA 05/03/2010  . BREAST Mcclain, BILATERAL 05/03/2010  . HIDRADENITIS SUPPURATIVA 02/08/2009    History reviewed. No pertinent surgical history.  OB History    No data available       Home Medications    Prior to Admission medications   Medication Sig Start Date End Date Taking? Authorizing Provider  ibuprofen (ADVIL,MOTRIN) 200 MG tablet Take 200 mg by mouth every 6 (six) hours as needed for fever or moderate Mcclain.   Yes [provider]  meloxicam (MOBIC) 15 MG tablet Take 1 tablet (15 mg total) by mouth daily. Patient not taking: Reported on 12/04/2016 09/26/16   Bethel Born, PA-C  ondansetron (ZOFRAN ODT) 8 MG disintegrating tablet Take 1 tablet (8 mg total) by mouth every 8 (eight) hours as needed for nausea or vomiting. Patient not taking: Reported on 08/20/2016 06/23/16   Linwood Dibbles, MD  ondansetron (ZOFRAN) 4 MG tablet Take 1 tablet  (4 mg total) by mouth every 8 (eight) hours as needed for nausea or vomiting. Patient not taking: Reported on 09/26/2016 08/20/16   Tilden Fossa, MD    Family History Family History  Problem Relation Age of Onset  . Hypertension Father   . Migraines Sister     Social History Social History  Substance Use Topics  . Smoking status: Current Every Day Smoker    Packs/day: 1.00    Types: Cigarettes  . Smokeless tobacco: Never Used  . Alcohol use No     Allergies   Cephalosporins and Penicillins   Review of Systems Review of Systems ROS reviewed and all are negative for acute change except as noted in the HPI.  Physical Exam Updated Vital Signs BP 134/86 (BP Location: Left Arm)   Pulse (!) 109   Temp 99.9 F (37.7 C) (Oral)   Resp 18   LMP 11/19/2016 (Approximate)   SpO2 100%   Physical Exam  Constitutional: She is oriented to person, place, and time. Vital signs are normal. She appears well-developed and well-nourished.  HENT:  Head: Normocephalic and atraumatic.  Right Ear: Hearing normal.  Left Ear: Hearing normal.  Eyes: Pupils are equal, round, and reactive to light. Conjunctivae and EOM are normal.  Neck: Normal range of motion. Neck supple.  Cardiovascular: Normal rate, regular rhythm, normal heart sounds and intact distal pulses.   Pulmonary/Chest: Effort normal and Donna sounds normal.  Abdominal: Soft.  There is no tenderness.  Musculoskeletal: Normal range of motion.  Neurological: She is alert and oriented to person, place, and time. She has normal strength. No cranial nerve deficit or sensory deficit.  Cranial Nerves:  II: Pupils equal, round, reactive to light III,IV, VI: ptosis not present, extra-ocular motions intact bilaterally  V,VII: smile symmetric, facial light touch sensation equal VIII: hearing grossly normal bilaterally  IX,X: midline uvula rise  XI: bilateral shoulder shrug equal and strong XII: midline tongue extension  Skin: Skin is  warm and dry.  Psychiatric: She has a normal mood and affect. Her speech is normal and behavior is normal. Thought content normal.  Nursing note and vitals reviewed.  ED Treatments / Results  Labs (all labs ordered are listed, but only abnormal results are displayed) Labs Reviewed - No data to display  EKG  EKG Interpretation None       Radiology No results found.  Procedures Procedures (including critical care time)  Medications Ordered in ED Medications - No data to display   Initial Impression / Assessment and Plan / ED Course  I have reviewed the triage vital signs and the nursing notes.  Pertinent labs & imaging results that were available during my care of the patient were reviewed by me and considered in my medical decision making (see chart for details).  Final Clinical Impressions(s) / ED Diagnoses   {I have reviewed the relevant previous healthcare records.  {I obtained HPI from historian.   ED Course:  Assessment: Patient is a 21 y.o. female  with a hx of HTN, presents to the Emergency Department today due to frontal headache with onset today. Notes throbbing sensation and rates 10/10. Gradual in onset. No N/V. No numbness/tingling. No vision changes. Noted hx same. States increased stress at home that caused elevated BP. Pt used to be on medication, but has stopped due to PCP change. Controlling with diet. Denies CP/SOB/ABD Mcclain. No fevers. No cough or URI symptoms. No neck stiffness. No meds PTA. Patient is without high-risk features of headache including: Sudden onset/thunderclap HA, No similar headache in past, Altered mental status, Accompanying seizure, Headache with exertion, Age > 50, History of immunocompromise, Neck or shoulder Mcclain, Fever, Use of anticoagulation, Family history of spontaneous SAH, Concomitant drug use, Toxic exposure.  Patient has a normal complete neurological exam, normal vital signs, normal level of consciousness, no signs of meningismus,  is well-appearing/non-toxic appearing, no signs of trauma. No papilledema, no Mcclain over the temporal arteries. Imaging with CT/MRI not indicated given history and physical exam findings. No dangerous or life-threatening conditions suspected or identified by history, physical exam, and by work-up. No indications for hospitalization identified. Given migraine cocktail with relief. At time of discharge, Patient is in no acute distress. Vital Signs are stable. Patient is able to ambulate. Patient able to tolerate PO.   Disposition/Plan:  DC Home Additional Verbal discharge instructions given and discussed with patient.  Pt Instructed to f/u with PCP in the next week for evaluation and treatment of symptoms. Return precautions given Pt acknowledges and agrees with plan  Supervising Physician Mabe, Latanya MaudlinMartha L, MD  Final diagnoses:  Acute nonintractable headache, unspecified headache type    New Prescriptions New Prescriptions   No medications on file     Audry PiliMohr, Yaiden Yang, Cordelia Poche-C 12/04/16 2303    Phillis HaggisMabe, Martha L, MD 12/04/16 2351

## 2016-12-04 NOTE — ED Triage Notes (Signed)
Pt c/o headache starting this morning. Reports light sensitivity, states increased stress recently due to loss of a loved one. Denies c/p, short of breath, abd pain.

## 2016-12-05 NOTE — ED Notes (Signed)
Patient is alert and oriented x3.  She was given DC instructions and follow up visit instructions.  Patient gave verbal understanding. She was DC ambulatory under her own power to home.  V/S stable.  He was not showing any signs of distress on DC 

## 2017-04-09 ENCOUNTER — Other Ambulatory Visit: Payer: Self-pay

## 2017-04-09 ENCOUNTER — Encounter (HOSPITAL_COMMUNITY): Payer: Self-pay | Admitting: Emergency Medicine

## 2017-04-09 ENCOUNTER — Ambulatory Visit (HOSPITAL_COMMUNITY)
Admission: EM | Admit: 2017-04-09 | Discharge: 2017-04-09 | Disposition: A | Discharge status: Home / Self Care | Payer: Self-pay | Attending: Family Medicine | Admitting: Family Medicine

## 2017-04-09 DIAGNOSIS — T2200XA Burn of unspecified degree of shoulder and upper limb, except wrist and hand, unspecified site, initial encounter: Secondary | ICD-10-CM

## 2017-04-09 DIAGNOSIS — X102XXA Contact with fats and cooking oils, initial encounter: Secondary | ICD-10-CM

## 2017-04-09 DIAGNOSIS — T22031A Burn of unspecified degree of right upper arm, initial encounter: Secondary | ICD-10-CM

## 2017-04-09 NOTE — Discharge Instructions (Signed)
Return for signs of infection:  increasing pain, redness or discharge.
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
## 2017-06-10 ENCOUNTER — Emergency Department (HOSPITAL_COMMUNITY)
Admission: EM | Admit: 2017-06-10 | Discharge: 2017-06-10 | Disposition: A | Discharge status: Home / Self Care | Payer: Self-pay | Attending: Emergency Medicine | Admitting: Emergency Medicine

## 2017-06-10 ENCOUNTER — Emergency Department (HOSPITAL_COMMUNITY): Payer: Self-pay

## 2017-06-10 ENCOUNTER — Encounter (HOSPITAL_COMMUNITY): Payer: Self-pay | Admitting: *Deleted

## 2017-06-10 ENCOUNTER — Other Ambulatory Visit: Payer: Self-pay

## 2017-06-10 DIAGNOSIS — R0602 Shortness of breath: Secondary | ICD-10-CM | POA: Insufficient documentation

## 2017-06-10 DIAGNOSIS — M62830 Muscle spasm of back: Secondary | ICD-10-CM | POA: Insufficient documentation

## 2017-06-10 DIAGNOSIS — I10 Essential (primary) hypertension: Secondary | ICD-10-CM | POA: Insufficient documentation

## 2017-06-10 DIAGNOSIS — R05 Cough: Secondary | ICD-10-CM | POA: Insufficient documentation

## 2017-06-10 DIAGNOSIS — Z79899 Other long term (current) drug therapy: Secondary | ICD-10-CM | POA: Insufficient documentation

## 2017-06-10 DIAGNOSIS — M546 Pain in thoracic spine: Secondary | ICD-10-CM | POA: Insufficient documentation

## 2017-06-10 DIAGNOSIS — F17210 Nicotine dependence, cigarettes, uncomplicated: Secondary | ICD-10-CM | POA: Insufficient documentation

## 2017-06-10 DIAGNOSIS — M62838 Other muscle spasm: Secondary | ICD-10-CM

## 2017-06-10 DIAGNOSIS — M542 Cervicalgia: Secondary | ICD-10-CM | POA: Insufficient documentation

## 2017-06-10 LAB — D-DIMER, QUANTITATIVE (NOT AT ARMC): D-Dimer, Quant: <0.27 ug/mL-FEU (ref 0.00–0.50)

## 2017-06-10 LAB — POC URINE PREG, ED: Preg Test, Ur: NEGATIVE

## 2017-06-10 MED ORDER — CYCLOBENZAPRINE HCL 10 MG PO TABS: 10.0000 mg | ORAL_TABLET | Freq: Two times a day (BID) | ORAL | 0 refills | Status: DC | PRN

## 2017-06-10 MED ORDER — KETOROLAC TROMETHAMINE 30 MG/ML IJ SOLN
30.0000 mg | Freq: Once | INTRAMUSCULAR | Status: AC
  Administered 2017-06-10: 30 mg via INTRAVENOUS
  Filled 2017-06-10: qty 1

## 2017-06-10 NOTE — ED Notes (Signed)
ED Provider at bedside. 

## 2017-06-10 NOTE — ED Triage Notes (Signed)
Pt reports right side back pain from neck down to mid back. Pain when turning her head. Denies injury or heavy lifting. Ambulatory at triage.

## 2017-06-10 NOTE — Discharge Instructions (Signed)

## 2017-06-10 NOTE — ED Provider Notes (Signed)
[facility] Provider [facility]   CSN: [id number] Arrival date & time: 06/10/17  0700     History   Chief Complaint Chief Complaint  Patient presents with  . Back Pain    HPI Donna Mcclain is a 22 y.o. female with history of hypertension, migraines, and seasonal allergies presents today for evaluation of acute onset, progressively worsening right-sided neck and thoracic back pain for 3 days.  She states that she awoke on Monday morning with sharp stabbing pain which starts at the neck and radiates down to her mid back.  Pain worsens with lateral rotation of the neck, cough, deep breath, and certain position changes.  She endorses shortness of breath specifically with cough but denies chest pain.  Cough is productive of clear-yellow sputum.  She denies nasal congestion, sore throat, fevers, or chills.  She has tried Aleve and topical muscle cream without significant relief of her symptoms.  She [place] but took frequent breaks.  No recent surgeries, no hemoptysis, not on OCPs or estrogen therapy, no prior history of DVT or PE.  Denies history of trauma or falls, no bending, lifting, or twisting injuries.  The history is provided by the patient.    Past Medical History:  Diagnosis Date  . Constipation   . Hypertension   . Seasonal allergies     Patient Active Problem List   Diagnosis Date Noted  . Migraine without aura and without status migrainosus, not intractable 07/09/2012  . Tension headache 07/09/2012  . Acute abdominal pain 03/24/2011  . ERYTHRASMA 05/03/2010  . BREAST PAIN, BILATERAL 05/03/2010  . HIDRADENITIS SUPPURATIVA 02/08/2009    History reviewed. No pertinent surgical history.  OB History    No data available       Home Medications    Prior to Admission medications   Medication Sig Start Date End Date Taking? Authorizing Provider  cyclobenzaprine (FLEXERIL) 10 MG  tablet Take 1 tablet (10 mg total) by mouth 2 (two) times daily as needed for muscle spasms. 06/10/17   Luevenia MaxinFawze, Joydan Gretzinger A, PA-C  ibuprofen (ADVIL,MOTRIN) 200 MG tablet Take 200 mg by mouth every 6 (six) hours as needed for fever or moderate pain.    [provider]  meloxicam (MOBIC) 15 MG tablet Take 1 tablet (15 mg total) by mouth daily. Patient not taking: Reported on 12/04/2016 09/26/16   Bethel BornGekas, Kelly Marie, PA-C  ondansetron (ZOFRAN ODT) 8 MG disintegrating tablet Take 1 tablet (8 mg total) by mouth every 8 (eight) hours as needed for nausea or vomiting. Patient not taking: Reported on 08/20/2016 06/23/16   Linwood DibblesKnapp, Jon, MD  ondansetron (ZOFRAN) 4 MG tablet Take 1 tablet (4 mg total) by mouth every 8 (eight) hours as needed for nausea or vomiting. Patient not taking: Reported on 09/26/2016 08/20/16   Tilden Fossaees, Elizabeth, MD    Family History Family History  Problem Relation Age of Onset  . Hypertension Father   . Migraines Sister     Social History Social History   Tobacco Use  . Smoking status: Current Every Day Smoker    Packs/day: 1.00    Types: Cigarettes  . Smokeless tobacco: Never Used  Substance Use Topics  . Alcohol use: No  . Drug use: No     Allergies   Cephalosporins and Penicillins   Review of Systems Review of Systems  Constitutional: Negative for chills and fever.  HENT: Negative for congestion and sore  throat.   Eyes: Negative for visual disturbance.  Respiratory: Positive for cough and shortness of breath.   Cardiovascular: Negative for chest pain.  Gastrointestinal: Negative for nausea and vomiting.  Musculoskeletal: Positive for back pain and neck pain.  Neurological: Negative for headaches.  All other systems reviewed and are negative.    Physical Exam Updated Vital Signs BP 127/86 (BP Location: Right Arm)   Pulse (!) 111   Temp 98 F (36.7 C) (Oral)   Resp 20   LMP 06/03/2017   SpO2 100%   Physical Exam  Constitutional: She appears  well-developed and well-nourished. No distress.  HENT:  Head: Normocephalic and atraumatic.  Eyes: Conjunctivae are normal. Right eye exhibits no discharge. Left eye exhibits no discharge.  Neck: Normal range of motion. Neck supple. No JVD present. No tracheal deviation present.  No midline spine TTP, right paracervical muscle tenderness overlying the trapezius muscle, no deformity, crepitus, or step-off noted.  Pain elicited with range of motion of the cervical spine but patient has full range of motion   Cardiovascular: Regular rhythm, normal heart sounds and intact distal pulses.  Tachycardic, 2+ radial and DP/PT pulses bl, negative Homan's bl, no LE edema   Pulmonary/Chest: Effort normal and breath sounds normal. She exhibits tenderness.  Equal rise and fall of chest, no increased work of breathing, speaking in full sentences without difficulty.  Diffuse right-sided posterior and lateral chest wall tenderness to palpation no deformity, crepitus, or paradoxical wall motion  Abdominal: Soft. Bowel sounds are normal. She exhibits no distension. There is no tenderness.  Musculoskeletal: Normal range of motion. She exhibits tenderness. She exhibits no edema.  No midline spine TTP, right parathoracic muscle tenderness, no deformity, crepitus, or step-off noted   Neurological: She is alert.  Skin: Skin is warm and dry. No erythema.  Psychiatric: She has a normal mood and affect. Her behavior is normal.  Nursing note and vitals reviewed.    ED Treatments / Results  Labs (all labs ordered are listed, but only abnormal results are displayed) Labs Reviewed  D-DIMER, QUANTITATIVE (NOT AT [facility])  POC URINE PREG, ED    EKG  EKG Interpretation None       Radiology Dg Chest 2 View  Result Date: 06/10/2017 CLINICAL DATA:  Right-sided back pain for 2 days, no known injury, initial encounter EXAM: CHEST  2 VIEW COMPARISON:  09/11/2014 FINDINGS: The heart size and mediastinal contours are  within normal limits. Both lungs are clear. The visualized skeletal structures are unremarkable. IMPRESSION: No active cardiopulmonary disease. Electronically Signed   By: Alcide Clever M.D.   On: 06/10/2017 08:14    Procedures Procedures (including critical care time)  Medications Ordered in ED Medications  ketorolac (TORADOL) 30 MG/ML injection 30 mg (30 mg Intravenous Given 06/10/17 0919)     Initial Impression / Assessment and Plan / ED Course  I have reviewed the triage vital signs and the nursing notes.  Pertinent labs & imaging results that were available during my care of the patient were reviewed by me and considered in my medical decision making (see chart for details).     Patient presents with acute onset of right-sided neck and thoracic back pain, entirely reproducible on palpation and with range of motion.  She does note pleuritic pain and shortness of breath and she is tachycardic, so cannot exclude PE.  However d-dimer is negative and this is reassuring and she is relatively low risk for PE.  I have a low suspicion  of PE at this time.  Chest x-ray shows no acute cardia pulmonary abnormality or rib injury.  Pain does not appear to be cardiac in etiology.  Suspect musculoskeletal pain and muscle spasm.  Discussed utility of ibuprofen, Tylenol, and small amount of muscle relaxant.  Discussed appropriate use of this medication.  Recommend follow-up with primary care physician if symptoms persist.  Discussed indications for return to the ED. Pt verbalized understanding of and agreement with plan and is safe for discharge home at this time.   Final Clinical Impressions(s) / ED Diagnoses   Final diagnoses:  Muscle spasm  Acute neck pain  Acute right-sided thoracic back pain    ED Discharge Orders        Ordered    cyclobenzaprine (FLEXERIL) 10 MG tablet  2 times daily PRN     06/10/17 1000       Jeanie Sewer, PA-C 06/10/17 1002    Alvira Monday, MD 06/10/17  2147

## 2017-09-02 ENCOUNTER — Other Ambulatory Visit: Payer: Self-pay

## 2017-09-02 ENCOUNTER — Encounter (HOSPITAL_COMMUNITY): Payer: Self-pay | Admitting: Emergency Medicine

## 2017-09-02 ENCOUNTER — Ambulatory Visit (HOSPITAL_COMMUNITY)
Admission: EM | Admit: 2017-09-02 | Discharge: 2017-09-02 | Disposition: A | Discharge status: Home / Self Care | Payer: Self-pay | Attending: Family Medicine | Admitting: Family Medicine

## 2017-09-02 DIAGNOSIS — R519 Headache, unspecified: Secondary | ICD-10-CM

## 2017-09-02 DIAGNOSIS — R110 Nausea: Secondary | ICD-10-CM

## 2017-09-02 DIAGNOSIS — R350 Frequency of micturition: Secondary | ICD-10-CM

## 2017-09-02 DIAGNOSIS — Z3202 Encounter for pregnancy test, result negative: Secondary | ICD-10-CM

## 2017-09-02 DIAGNOSIS — R51 Headache: Secondary | ICD-10-CM

## 2017-09-02 LAB — POCT URINALYSIS DIP (DEVICE)
Bilirubin Urine: NEGATIVE
Glucose, UA: NEGATIVE mg/dL
Ketones, ur: NEGATIVE mg/dL
Leukocytes, UA: NEGATIVE
Nitrite: NEGATIVE
PROTEIN: NEGATIVE mg/dL
SPECIFIC GRAVITY, URINE: 1.01 (ref 1.005–1.030)
UROBILINOGEN UA: 0.2 mg/dL (ref 0.0–1.0)
pH: 8.5 — ABNORMAL HIGH (ref 5.0–8.0)

## 2017-09-02 LAB — POCT PREGNANCY, URINE: Preg Test, Ur: NEGATIVE

## 2017-09-02 MED ORDER — METOCLOPRAMIDE HCL 5 MG/ML IJ SOLN
INTRAMUSCULAR | Status: AC
  Filled 2017-09-02: qty 2

## 2017-09-02 MED ORDER — KETOROLAC TROMETHAMINE 60 MG/2ML IM SOLN
INTRAMUSCULAR | Status: AC
  Filled 2017-09-02: qty 2

## 2017-09-02 MED ORDER — KETOROLAC TROMETHAMINE 60 MG/2ML IM SOLN
60.0000 mg | Freq: Once | INTRAMUSCULAR | Status: AC
  Administered 2017-09-02: 60 mg via INTRAMUSCULAR

## 2017-09-02 MED ORDER — DEXAMETHASONE SODIUM PHOSPHATE 10 MG/ML IJ SOLN
10.0000 mg | Freq: Once | INTRAMUSCULAR | Status: AC
Start: 2017-09-02 — End: 2017-09-02
  Administered 2017-09-02: 10 mg via INTRAMUSCULAR

## 2017-09-02 MED ORDER — METOCLOPRAMIDE HCL 5 MG/ML IJ SOLN
5.0000 mg | Freq: Once | INTRAMUSCULAR | Status: AC
  Administered 2017-09-02: 5 mg via INTRAMUSCULAR

## 2017-09-02 MED ORDER — DEXAMETHASONE SODIUM PHOSPHATE 10 MG/ML IJ SOLN
INTRAMUSCULAR | Status: AC
  Filled 2017-09-02: qty 1

## 2017-09-02 NOTE — ED Provider Notes (Signed)
[facility]    CSN: [id number] Arrival date & time: 09/02/17  1412     History   Chief Complaint Chief Complaint  Patient presents with  . Nausea    HPI Donna Mcclain is a 22 y.o. female.   22 year old female comes in for 5-day history of headache and nausea.  This started about the same time.  Frontal headache, constant, throbbing in sensation.  No obvious aggravating or alleviating factor.  Denies photophobia, phonophobia.  Denies URI symptoms such as cough, congestion, sore throat.  Has had nausea without vomiting.  States has not been able to eat as much due to nausea but has continued to keep food down.  States some generalized abdominal pain that makes her  feel "weird" that is intermittent cramping.  No aggravating or alleviating factor.  Some diarrhea today, was performed, warm episodes.  Urinary frequency without dysuria, urgency, hematuria.  Denies vaginal discharge, itching, pain. LMP 08/19/2017  Sexually active with one female partner, no condom use, no birth control use.     Past Medical History:  Diagnosis Date  . Constipation   . Hypertension   . Seasonal allergies     Patient Active Problem List   Diagnosis Date Noted  . Migraine without aura and without status migrainosus, not intractable 07/09/2012  . Tension headache 07/09/2012  . Acute abdominal pain 03/24/2011  . ERYTHRASMA 05/03/2010  . BREAST PAIN, BILATERAL 05/03/2010  . HIDRADENITIS SUPPURATIVA 02/08/2009    History reviewed. No pertinent surgical history.  OB History   None      Home Medications    Prior to Admission medications   Medication Sig Start Date End Date Taking? Authorizing Provider  ibuprofen (ADVIL,MOTRIN) 200 MG tablet Take 200 mg by mouth every 6 (six) hours as needed for fever or moderate pain.    [provider]  meloxicam (MOBIC) 15 MG tablet Take 1 tablet (15 mg total) by mouth daily. Patient not taking: Reported on 12/04/2016 09/26/16   Bethel Born, PA-C  ondansetron (ZOFRAN ODT) 8 MG disintegrating tablet Take 1 tablet (8 mg total) by mouth every 8 (eight) hours as needed for nausea or vomiting. Patient not taking: Reported on 08/20/2016 06/23/16   Linwood Dibbles, MD  ondansetron (ZOFRAN) 4 MG tablet Take 1 tablet (4 mg total) by mouth every 8 (eight) hours as needed for nausea or vomiting. Patient not taking: Reported on 09/26/2016 08/20/16   Tilden Fossa, MD    Family History Family History  Problem Relation Age of Onset  . Hypertension Father   . Migraines Sister     Social History Social History   Tobacco Use  . Smoking status: Current Every Day Smoker    Packs/day: 1.00    Types: Cigarettes  . Smokeless tobacco: Never Used  Substance Use Topics  . Alcohol use: No  . Drug use: No     Allergies   Cephalosporins and Penicillins   Review of Systems Review of Systems  Reason unable to perform ROS: See HPI as above.     Physical Exam Triage Vital Signs ED Triage Vitals  Enc Vitals Group     BP 09/02/17 1454 132/86     Pulse Rate 09/02/17 1454 (!) 110     Resp 09/02/17 1454 18     Temp 09/02/17 1454 100.1 F (37.8 C)     Temp Source 09/02/17 1454 Oral     SpO2 09/02/17 1454 100 %     Weight --  Height --      Head Circumference --      Peak Flow --      Pain Score 09/02/17 1452 4     Pain Loc --      Pain Edu? --      Excl. in GC? --    No data found.  Updated Vital Signs BP 132/86 (BP Location: Left Arm)   Pulse (!) 110   Temp 100.1 F (37.8 C) (Oral)   Resp 18   LMP 08/19/2017   SpO2 100%   Physical Exam  Constitutional: She is oriented to person, place, and time. She appears well-developed and well-nourished. No distress.  HENT:  Head: Normocephalic and atraumatic.  Right Ear: Tympanic membrane, external ear and ear canal normal. Tympanic membrane is not erythematous and not bulging.  Left Ear: Tympanic membrane, external ear and ear canal normal. Tympanic membrane is not erythematous  and not bulging.  Nose: Nose normal. Right sinus exhibits no maxillary sinus tenderness and no frontal sinus tenderness. Left sinus exhibits no maxillary sinus tenderness and no frontal sinus tenderness.  Mouth/Throat: Uvula is midline, oropharynx is clear and moist and mucous membranes are normal.  Eyes: Pupils are equal, round, and reactive to light. Conjunctivae and EOM are normal.  Neck: Normal range of motion. Neck supple.  Cardiovascular: Normal rate, regular rhythm and normal heart sounds. Exam reveals no gallop and no friction rub.  No murmur heard. Pulmonary/Chest: Effort normal and breath sounds normal. No stridor. No respiratory distress. She has no decreased breath sounds. She has no wheezes. She has no rhonchi. She has no rales.  Abdominal: Soft. Bowel sounds are normal. She exhibits no distension. There is no tenderness. There is no rebound and no guarding.  Lymphadenopathy:    She has no cervical adenopathy.  Neurological: She is alert and oriented to person, place, and time. She has normal strength. She is not disoriented. No cranial nerve deficit or sensory deficit. She displays a negative Romberg sign. Coordination and gait normal. GCS eye subscore is 4. GCS verbal subscore is 5. GCS motor subscore is 6.  Normal finger to nose, rapid movement.  Skin: Skin is warm and dry.  Psychiatric: She has a normal mood and affect. Her behavior is normal. Judgment normal.     UC Treatments / Results  Labs (all labs ordered are listed, but only abnormal results are displayed) Labs Reviewed  POCT URINALYSIS DIP (DEVICE) - Abnormal; Notable for the following components:      Result Value   Hgb urine dipstick TRACE (*)    pH 8.5 (*)    All other components within normal limits  POCT PREGNANCY, URINE    EKG None  Radiology No results found.  Procedures Procedures (including critical care time)  Medications Ordered in UC Medications  ketorolac (TORADOL) injection 60 mg (60 mg  Intramuscular Given 09/02/17 1609)  metoCLOPramide (REGLAN) injection 5 mg (5 mg Intramuscular Given 09/02/17 1610)  dexamethasone (DECADRON) injection 10 mg (10 mg Intramuscular Given 09/02/17 1609)    Initial Impression / Assessment and Plan / UC Course  I have reviewed the triage vital signs and the nursing notes.  Pertinent labs & imaging results that were available during my care of the patient were reviewed by me and considered in my medical decision making (see chart for details).    No alarming signs on exam.  Will treat with headache cocktail of Toradol, Reglan, Decadron injection.  Will have patient continue to monitor symptoms.  Return precautions given.  Patient expresses understanding and agrees to plan.  Final Clinical Impressions(s) / UC Diagnoses   Final diagnoses:  Acute intractable headache, unspecified headache type  Nausea    ED Prescriptions    None        Belinda Fisher, PA-C 09/02/17 2007

## 2017-09-02 NOTE — Discharge Instructions (Signed)
No alarming signs on exam. Toradol, reglan, decadron injection in office today for headache and nausea. You can take over the counter ibuprofen  three times day to help with headache as needed. You can also try over the counter flonase/allergy medicine, to see if symptoms are caused by allergy/sinus pressure. Keep hydrated, your urine should be clear to pale yellow in color. Monitor for any changes, worsening headache, sensitivity to light, vomiting, confusion, worsening abdominal pain, go to the emergency department for further evaluation needed.

## 2017-09-02 NOTE — ED Triage Notes (Signed)
Nausea and headache for 5 days.  Urinating frequently

## 2018-01-12 ENCOUNTER — Emergency Department (HOSPITAL_COMMUNITY): Payer: Medicaid Other

## 2018-01-12 ENCOUNTER — Emergency Department (HOSPITAL_COMMUNITY)
Admission: EM | Admit: 2018-01-12 | Discharge: 2018-01-12 | Disposition: A | Discharge status: Home / Self Care | Payer: Medicaid Other | Attending: Emergency Medicine | Admitting: Emergency Medicine

## 2018-01-12 ENCOUNTER — Encounter (HOSPITAL_COMMUNITY): Payer: Self-pay

## 2018-01-12 ENCOUNTER — Other Ambulatory Visit: Payer: Self-pay

## 2018-01-12 DIAGNOSIS — B9789 Other viral agents as the cause of diseases classified elsewhere: Secondary | ICD-10-CM

## 2018-01-12 DIAGNOSIS — J302 Other seasonal allergic rhinitis: Secondary | ICD-10-CM | POA: Insufficient documentation

## 2018-01-12 DIAGNOSIS — I10 Essential (primary) hypertension: Secondary | ICD-10-CM | POA: Insufficient documentation

## 2018-01-12 DIAGNOSIS — F17210 Nicotine dependence, cigarettes, uncomplicated: Secondary | ICD-10-CM | POA: Insufficient documentation

## 2018-01-12 DIAGNOSIS — J069 Acute upper respiratory infection, unspecified: Secondary | ICD-10-CM

## 2018-01-12 DIAGNOSIS — Z79899 Other long term (current) drug therapy: Secondary | ICD-10-CM | POA: Insufficient documentation

## 2018-01-12 MED ORDER — FLUTICASONE PROPIONATE 50 MCG/ACT NA SUSP: 2.0000 | Freq: Every day | NASAL | 0 refills | Status: DC

## 2018-01-12 MED ORDER — GUAIFENESIN ER 600 MG PO TB12: 600.0000 mg | ORAL_TABLET | Freq: Two times a day (BID) | ORAL | 0 refills | Status: DC

## 2018-01-12 MED ORDER — BENZONATATE 100 MG PO CAPS: 100.0000 mg | ORAL_CAPSULE | Freq: Three times a day (TID) | ORAL | 0 refills | Status: DC

## 2018-01-12 NOTE — ED Triage Notes (Addendum)
Pt states she has had cough, nasal congestion, headache, that started 1 week ago. Pt also reports one episode of vomiting this morning.  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 Skin warm and dry. No distress noted.

## 2018-01-12 NOTE — ED Provider Notes (Signed)
[facility] EMERGENCY DEPARTMENT Provider Note   CSN: [id number] Arrival date & time: 01/12/18  4098     History   Chief Complaint Chief Complaint  Patient presents with  . URI    HPI NILAH BELCOURT is a 22 y.o. female.  HPI  Patient is a 22 year old female who presents the emergency department today for evaluation of productive cough with clear/yellow sputum, nasal congestion, sinus pressure, sinus headache, nausea and one episode of vomiting.  URI symptoms started about 1 week ago.  Episode of vomiting occurred this morning.  Denies posttussis emesis.  States she has had significant postnasal drainage.  No significant abdominal pain, diarrhea or urinary symptoms.  States she has been intermittently constipated over the last week.  No fevers or chills.  No chest pain or shortness of breath.  No lower extremity swelling.  Past Medical History:  Diagnosis Date  . Constipation   . Hypertension   . Seasonal allergies     Patient Active Problem List   Diagnosis Date Noted  . Migraine without aura and without status migrainosus, not intractable 07/09/2012  . Tension headache 07/09/2012  . Acute abdominal pain 03/24/2011  . ERYTHRASMA 05/03/2010  . BREAST PAIN, BILATERAL 05/03/2010  . HIDRADENITIS SUPPURATIVA 02/08/2009    History reviewed. No pertinent surgical history.   OB History   None      Home Medications    Prior to Admission medications   Medication Sig Start Date End Date Taking? Authorizing Provider  acetaminophen (TYLENOL) 500 MG tablet Take 500 mg by mouth every 6 (six) hours as needed for headache.    Yes [provider]  Aspirin-Salicylamide-Caffeine (BC HEADACHE POWDER PO) Take 1 packet by mouth once.   Yes [provider]  ibuprofen (ADVIL,MOTRIN) 200 MG tablet Take 200 mg by mouth every 6 (six) hours as needed for fever or moderate pain.   Yes [provider]  benzonatate (TESSALON) 100 MG capsule Take  1 capsule (100 mg total) by mouth every 8 (eight) hours. 01/12/18   Demontray Franta S, PA-C  fluticasone (FLONASE) 50 MCG/ACT nasal spray Place 2 sprays into both nostrils daily. 01/12/18   [place] S, PA-C  guaiFENesin (MUCINEX) 600 MG 12 hr tablet Take 1 tablet (600 mg total) by mouth 2 (two) times daily. 01/12/18   Anwyn Kriegel S, PA-C  meloxicam (MOBIC) 15 MG tablet Take 1 tablet (15 mg total) by mouth daily. Patient not taking: Reported on 12/04/2016 09/26/16   Bethel Born, PA-C  ondansetron (ZOFRAN ODT) 8 MG disintegrating tablet Take 1 tablet (8 mg total) by mouth every 8 (eight) hours as needed for nausea or vomiting. Patient not taking: Reported on 08/20/2016 06/23/16   Linwood Dibbles, MD  ondansetron (ZOFRAN) 4 MG tablet Take 1 tablet (4 mg total) by mouth every 8 (eight) hours as needed for nausea or vomiting. Patient not taking: Reported on 09/26/2016 08/20/16   Tilden Fossa, MD    Family History Family History  Problem Relation Age of Onset  . Hypertension Father   . Migraines Sister     Social History Social History   Tobacco Use  . Smoking status: Current Every Day Smoker    Packs/day: 1.00    Types: Cigarettes  . Smokeless tobacco: Never Used  Substance Use Topics  . Alcohol use: No  . Drug use: No     Allergies   Cephalosporins and Penicillins   Review of Systems Review of Systems  Constitutional: Negative  for chills and fever.  HENT: Positive for congestion, postnasal drip, sinus pressure, sinus pain and sore throat. Negative for ear pain and rhinorrhea.   Eyes: Negative for pain and visual disturbance.  Respiratory: Positive for cough. Negative for shortness of breath.   Cardiovascular: Negative for chest pain, palpitations and leg swelling.  Gastrointestinal: Positive for nausea and vomiting. Negative for abdominal pain, constipation and diarrhea.  Genitourinary: Negative for dysuria and hematuria.  Musculoskeletal: Negative for back pain.  Skin:  Negative for color change and rash.  Neurological: Positive for headaches. Negative for dizziness, seizures, syncope, weakness, light-headedness and numbness.  All other systems reviewed and are negative.  Physical Exam Updated Vital Signs BP 126/78 (BP Location: Right Arm)   Pulse 82   Temp 98.7 F (37.1 C) (Oral)   Resp 16   Ht 5\' 2"  (1.575 m)   Wt 82.6 kg   LMP 01/05/2018 (Within Days)   SpO2 99%   BMI 33.29 kg/m   Physical Exam  Constitutional: She appears well-developed and well-nourished. No distress.  HENT:  Head: Normocephalic and atraumatic.  Bilateral TMs without erythema or effusion.  No pharyngeal erythema.  No tonsillar swelling or exudates.  Nasal turbinates swollen, more so on the left.  Tenderness with palpation of frontal and maxillary sinuses.  No cervical adenopathy.  Eyes: Conjunctivae and EOM are normal.  Neck: Neck supple.  Cardiovascular: Normal rate, regular rhythm, normal heart sounds and intact distal pulses.  No murmur heard. Pulmonary/Chest: Effort normal and breath sounds normal. No stridor. No respiratory distress. She has no wheezes.  Abdominal: Soft. Bowel sounds are normal. She exhibits no distension. There is no tenderness. There is no guarding.  Musculoskeletal: She exhibits no edema.  Neurological: She is alert.  Skin: Skin is warm and dry.  Psychiatric: She has a normal mood and affect.  Nursing note and vitals reviewed.  ED Treatments / Results  Labs (all labs ordered are listed, but only abnormal results are displayed) Labs Reviewed - No data to display  EKG None  Radiology Dg Chest 2 View  Result Date: 01/12/2018 CLINICAL DATA:  Cough and chills EXAM: CHEST - 2 VIEW COMPARISON:  June 10, 2017 FINDINGS: Lungs are clear. Heart size and pulmonary vascularity are normal. No adenopathy. No pneumothorax. No bone lesions. IMPRESSION: No edema or consolidation. Electronically Signed   By: Bretta Bang III M.D.   On: 01/12/2018  10:52    Procedures Procedures (including critical care time)  Medications Ordered in ED Medications - No data to display   Initial Impression / Assessment and Plan / ED Course  I have reviewed the triage vital signs and the nursing notes.  Pertinent labs & imaging results that were available during my care of the patient were reviewed by me and considered in my medical decision making (see chart for details).     Final Clinical Impressions(s) / ED Diagnoses   Final diagnoses:  Viral URI with cough   Pt CXR negative for acute infiltrate. Patients symptoms are consistent with URI, likely viral etiology. Discussed that antibiotics are not indicated for viral infections. Pt will be discharged with symptomatic treatment.  Verbalizes understanding and is agreeable with plan. Pt is hemodynamically stable & in NAD prior to dc.  ED Discharge Orders         Ordered    fluticasone (FLONASE) 50 MCG/ACT nasal spray  Daily     01/12/18 1456    guaiFENesin (MUCINEX) 600 MG 12 hr tablet  2  times daily     01/12/18 1456    benzonatate (TESSALON) 100 MG capsule  Every 8 hours     01/12/18 1456           Ajooni Karam S, PA-C 01/12/18 1457    Melene PlanFloyd, Dan, DO 01/12/18 2222

## 2018-01-12 NOTE — Discharge Instructions (Addendum)

## 2018-05-25 ENCOUNTER — Emergency Department (HOSPITAL_COMMUNITY): Payer: Self-pay

## 2018-05-25 ENCOUNTER — Encounter (HOSPITAL_COMMUNITY): Payer: Self-pay

## 2018-05-25 ENCOUNTER — Emergency Department (HOSPITAL_COMMUNITY)
Admission: EM | Admit: 2018-05-25 | Discharge: 2018-05-25 | Disposition: A | Discharge status: Home / Self Care | Payer: Self-pay | Attending: Emergency Medicine | Admitting: Emergency Medicine

## 2018-05-25 ENCOUNTER — Other Ambulatory Visit: Payer: Self-pay

## 2018-05-25 DIAGNOSIS — I10 Essential (primary) hypertension: Secondary | ICD-10-CM | POA: Insufficient documentation

## 2018-05-25 DIAGNOSIS — Z79899 Other long term (current) drug therapy: Secondary | ICD-10-CM | POA: Insufficient documentation

## 2018-05-25 DIAGNOSIS — R112 Nausea with vomiting, unspecified: Secondary | ICD-10-CM | POA: Insufficient documentation

## 2018-05-25 DIAGNOSIS — F17210 Nicotine dependence, cigarettes, uncomplicated: Secondary | ICD-10-CM | POA: Insufficient documentation

## 2018-05-25 DIAGNOSIS — R52 Pain, unspecified: Secondary | ICD-10-CM

## 2018-05-25 DIAGNOSIS — J0100 Acute maxillary sinusitis, unspecified: Secondary | ICD-10-CM | POA: Insufficient documentation

## 2018-05-25 LAB — POC URINE PREG, ED: Preg Test, Ur: NEGATIVE

## 2018-05-25 MED ORDER — BENZONATATE 100 MG PO CAPS: 100.0000 mg | ORAL_CAPSULE | Freq: Three times a day (TID) | ORAL | 0 refills | Status: DC | PRN

## 2018-05-25 MED ORDER — ONDANSETRON 4 MG PO TBDP: 4.0000 mg | ORAL_TABLET | Freq: Three times a day (TID) | ORAL | 0 refills | Status: DC | PRN

## 2018-05-25 MED ORDER — FLUTICASONE PROPIONATE 50 MCG/ACT NA SUSP: 2.0000 | Freq: Every day | NASAL | 0 refills | Status: DC

## 2018-05-25 MED ORDER — ONDANSETRON 4 MG PO TBDP
4.0000 mg | ORAL_TABLET | Freq: Once | ORAL | Status: AC
  Administered 2018-05-25: 4 mg via ORAL
  Filled 2018-05-25: qty 1

## 2018-05-25 MED ORDER — DOXYCYCLINE HYCLATE 100 MG PO CAPS: 100.0000 mg | ORAL_CAPSULE | Freq: Two times a day (BID) | ORAL | 0 refills | Status: AC

## 2018-05-25 NOTE — Discharge Instructions (Addendum)
Your chest x-ray was negative.  You likely have a sinusitis based on your symptoms.  Please take all of your antibiotics until finished!   You may develop abdominal discomfort or diarrhea from the antibiotic.  You may help offset this with probiotics which you can buy or get in yogurt. Do not eat  or take the probiotics until 2 hours after your antibiotic.   Drink plenty water and get plenty of rest. Gargle warm salt water and spit it out for sore throat. May also use cough drops, warm teas, etc. Take flonase to decrease nasal congestion.  Tessalon as needed for cough. Alternate 600 mg of ibuprofen and [phone] mg of Tylenol every 3 hours as needed for pain or fever. Do not exceed 4000 mg of Tylenol daily.  Take Zofran as needed for nausea and vomiting.  Let this medicine dissolve under your tongue.  Wait around 10 to 20 minutes before having anything to eat or drink to give this medicine time to work.   Followup with your primary care doctor in 5-7 days for recheck of ongoing symptoms. Return to emergency department for emergent changing or worsening of symptoms such as throat tightness, facial swelling, fever not controlled by ibuprofen or Tylenol,difficulty breathing, or chest pain.

## 2018-05-25 NOTE — ED Triage Notes (Signed)
Pt here c/o productive cough, runny nose , congestion , headaches , body aches x 3 weeks ; pt states she has been taking mucinex and motrin but hasnt been helping

## 2018-05-25 NOTE — ED Provider Notes (Signed)
[facility] EMERGENCY DEPARTMENT Provider Note   CSN: [id number] Arrival date & time: 05/25/18  3832     History   Chief Complaint Chief Complaint  Patient presents with  . Cough    HPI MARVEL RASH is a 23 y.o. female with history of hypertension, seasonal allergies, migraines presents for evaluation of acute onset, persistent flulike symptoms for 3 weeks.  She notes nasal congestion, sinus pressure, generalized headaches, cough productive of brown sputum.  Reports that in the last week or so she has had subjective fevers and chills, generalized abdominal pain, and occasional nausea and vomiting.  Emesis is nonbloody and nonbilious.  Notes generalized body aches including aching chest pain with cough only.  Has been taking Mucinex and Motrin with little relief.  She is a current smoker.  The history is provided by the patient.    Past Medical History:  Diagnosis Date  . Constipation   . Hypertension   . Seasonal allergies     Patient Active Problem List   Diagnosis Date Noted  . Migraine without aura and without status migrainosus, not intractable 07/09/2012  . Tension headache 07/09/2012  . Acute abdominal pain 03/24/2011  . ERYTHRASMA 05/03/2010  . BREAST PAIN, BILATERAL 05/03/2010  . HIDRADENITIS SUPPURATIVA 02/08/2009    History reviewed. No pertinent surgical history.   OB History   No obstetric history on file.      Home Medications    Prior to Admission medications   Medication Sig Start Date End Date Taking? Authorizing Provider  acetaminophen (TYLENOL) 500 MG tablet Take 500 mg by mouth every 6 (six) hours as needed for headache.     [provider]  Aspirin-Salicylamide-Caffeine (BC HEADACHE POWDER PO) Take 1 packet by mouth once.    [provider]  benzonatate (TESSALON) 100 MG capsule Take 1 capsule (100 mg total) by mouth 3 (three) times daily as needed for cough. 05/25/18   Jevaun Strick A, PA-C  doxycycline  (VIBRAMYCIN) 100 MG capsule Take 1 capsule (100 mg total) by mouth 2 (two) times daily for 7 days. 05/25/18 06/01/18  Michela Pitcher A, PA-C  fluticasone (FLONASE) 50 MCG/ACT nasal spray Place 2 sprays into both nostrils daily. 05/25/18   Calla Wedekind A, PA-C  guaiFENesin (MUCINEX) 600 MG 12 hr tablet Take 1 tablet (600 mg total) by mouth 2 (two) times daily. 01/12/18   Couture, Cortni S, PA-C  ibuprofen (ADVIL,MOTRIN) 200 MG tablet Take 200 mg by mouth every 6 (six) hours as needed for fever or moderate pain.    [provider]  meloxicam (MOBIC) 15 MG tablet Take 1 tablet (15 mg total) by mouth daily. Patient not taking: Reported on 12/04/2016 09/26/16   Bethel Born, PA-C  ondansetron (ZOFRAN ODT) 4 MG disintegrating tablet Take 1 tablet (4 mg total) by mouth every 8 (eight) hours as needed for nausea or vomiting. 05/25/18   Aneira Cavitt A, PA-C  ondansetron (ZOFRAN) 4 MG tablet Take 1 tablet (4 mg total) by mouth every 8 (eight) hours as needed for nausea or vomiting. Patient not taking: Reported on 09/26/2016 08/20/16   Tilden Fossa, MD    Family History Family History  Problem Relation Age of Onset  . Hypertension Father   . Migraines Sister     Social History Social History   Tobacco Use  . Smoking status: Current Every Day Smoker    Packs/day: 1.00    Types: Cigarettes  . Smokeless tobacco: Never Used  Substance  Use Topics  . Alcohol use: No  . Drug use: No     Allergies   Cephalosporins and Penicillins   Review of Systems Review of Systems  Constitutional: Positive for chills and fever.  HENT: Positive for congestion, sinus pressure, sinus pain and sore throat (resolved).   Respiratory: Positive for cough.   Cardiovascular: Positive for chest pain (with cough only).  Gastrointestinal: Positive for abdominal pain, nausea and vomiting. Negative for constipation and diarrhea.  Genitourinary: Negative for dysuria, hematuria and urgency.  Musculoskeletal: Positive for  myalgias.  All other systems reviewed and are negative.    Physical Exam Updated Vital Signs BP (!) 126/95 (BP Location: Right Arm)   Pulse 96   Temp 97.9 F (36.6 C) (Oral)   Resp 16   Ht 5\' 2"  (1.575 m)   Wt 80.7 kg   LMP 05/16/2018   SpO2 100%   BMI 32.56 kg/m   Physical Exam Vitals signs and nursing note reviewed.  Constitutional:      General: She is not in acute distress.    Appearance: She is well-developed.     Comments: Resting comfortably in chair, talking on phone  HENT:     Head: Normocephalic and atraumatic.     Right Ear: Tympanic membrane, ear canal and external ear normal.     Left Ear: Tympanic membrane, ear canal and external ear normal.     Nose: Congestion present.     Comments: Bilateral maxillary sinus tenderness    Mouth/Throat:     Mouth: Mucous membranes are moist.  Eyes:     General:        Right eye: No discharge.        Left eye: No discharge.     Extraocular Movements: Extraocular movements intact.     Conjunctiva/sclera: Conjunctivae normal.     Pupils: Pupils are equal, round, and reactive to light.  Neck:     Musculoskeletal: Normal range of motion and neck supple. No neck rigidity.     Vascular: No JVD.     Trachea: No tracheal deviation.  Cardiovascular:     Rate and Rhythm: Normal rate.     Pulses: Normal pulses.     Heart sounds: Normal heart sounds.  Pulmonary:     Effort: Pulmonary effort is normal.     Breath sounds: Normal breath sounds.     Comments: Diffuse generalized tenderness to palpation of the chest wall with no deformity, crepitus, ecchymosis, or flail segment Chest:     Chest wall: Tenderness present.  Abdominal:     General: Abdomen is flat. Bowel sounds are normal. There is no distension.     Palpations: Abdomen is soft.     Tenderness: There is abdominal tenderness. There is no guarding or rebound.     Comments: Generalized tenderness palpation with no focal tenderness  Lymphadenopathy:     Cervical: No  cervical adenopathy.  Skin:    General: Skin is warm and dry.     Findings: No erythema.  Neurological:     Mental Status: She is alert.  Psychiatric:        Behavior: Behavior normal.      ED Treatments / Results  Labs (all labs ordered are listed, but only abnormal results are displayed) Labs Reviewed  POC URINE PREG, ED    EKG None  Radiology Dg Chest 2 View  Result Date: 05/25/2018 CLINICAL DATA:  Cough and fever EXAM: CHEST - 2 VIEW COMPARISON:  January 12, 2018 FINDINGS: The lungs are clear. Heart size and pulmonary vascularity are normal. No adenopathy. No bone lesions. IMPRESSION: No edema or consolidation. Electronically Signed   By: Bretta Bang III M.D.   On: 05/25/2018 08:50    Procedures Procedures (including critical care time)  Medications Ordered in ED Medications  ondansetron (ZOFRAN-ODT) disintegrating tablet 4 mg (4 mg Oral Given 05/25/18 0820)     Initial Impression / Assessment and Plan / ED Course  I have reviewed the triage vital signs and the nursing notes.  Pertinent labs & imaging results that were available during my care of the patient were reviewed by me and considered in my medical decision making (see chart for details).     Patient with nasal congestion, cough, myalgias for 3 weeks.  Developed subjective fevers and chills, nausea, vomiting, generalized abdominal pain this week.  She is afebrile, vital signs are stable.  She is nontoxic in appearance.  She appears well-hydrated.  Abdomen is diffusely tender with no focal tenderness.  Doubt acute surgical abdominal pathology.  Do not feel that lab work is necessary given normal vital signs and moist mucous membranes.  Doubt that she is severely dehydrated.  Chest x-ray shows no edema or consolidation.  Given symptoms have been ongoing for 3 weeks and she has congestion and sinus pressure, will treat for sinusitis with doxycycline.  Also discussed symptomatic management.  Recommend follow  with PCP if symptoms persist.  She was given Zofran and p.o. challenged in the ED, tolerated p.o. fluids without difficulty.  Discussed strict ED return precautions.  Patient and mother verbalized understanding of and agreement with plan and patient stable for discharge home at this time.  Final Clinical Impressions(s) / ED Diagnoses   Final diagnoses:  Acute maxillary sinusitis, recurrence not specified  Non-intractable vomiting with nausea, unspecified vomiting type  Generalized body aches    ED Discharge Orders         Ordered    ondansetron (ZOFRAN ODT) 4 MG disintegrating tablet  Every 8 hours PRN     05/25/18 0857    doxycycline (VIBRAMYCIN) 100 MG capsule  2 times daily     05/25/18 0857    fluticasone (FLONASE) 50 MCG/ACT nasal spray  Daily     05/25/18 0857    benzonatate (TESSALON) 100 MG capsule  3 times daily PRN     05/25/18 0857           Jeanie Sewer, PA-C 05/25/18 0859    Sabas Sous, MD 05/25/18 1436

## 2018-05-25 NOTE — ED Notes (Signed)
Ginger ale given to patient for PO challenge.

## 2018-05-25 NOTE — ED Notes (Signed)
Pt able to keep ginger ale down ; no n.v

## 2018-12-10 ENCOUNTER — Emergency Department (HOSPITAL_COMMUNITY)
Admission: EM | Admit: 2018-12-10 | Discharge: 2018-12-10 | Disposition: A | Discharge status: Home / Self Care | Payer: Medicaid Other | Attending: Emergency Medicine | Admitting: Emergency Medicine

## 2018-12-10 ENCOUNTER — Other Ambulatory Visit: Payer: Self-pay

## 2018-12-10 ENCOUNTER — Encounter (HOSPITAL_COMMUNITY): Payer: Self-pay | Admitting: Emergency Medicine

## 2018-12-10 DIAGNOSIS — I10 Essential (primary) hypertension: Secondary | ICD-10-CM | POA: Insufficient documentation

## 2018-12-10 DIAGNOSIS — F17210 Nicotine dependence, cigarettes, uncomplicated: Secondary | ICD-10-CM | POA: Insufficient documentation

## 2018-12-10 DIAGNOSIS — Z79899 Other long term (current) drug therapy: Secondary | ICD-10-CM | POA: Insufficient documentation

## 2018-12-10 DIAGNOSIS — L03011 Cellulitis of right finger: Secondary | ICD-10-CM

## 2018-12-10 DIAGNOSIS — L03113 Cellulitis of right upper limb: Secondary | ICD-10-CM | POA: Insufficient documentation

## 2018-12-10 DIAGNOSIS — S6992XA Unspecified injury of left wrist, hand and finger(s), initial encounter: Secondary | ICD-10-CM

## 2018-12-10 LAB — I-STAT BETA HCG BLOOD, ED (MC, WL, AP ONLY): I-stat hCG, quantitative: <5 m[IU]/mL (ref <5)

## 2018-12-10 MED ORDER — DOXYCYCLINE HYCLATE 100 MG PO CAPS: 100.0000 mg | ORAL_CAPSULE | Freq: Two times a day (BID) | ORAL | 0 refills | Status: AC

## 2018-12-10 NOTE — ED Triage Notes (Signed)
Pt states she tried "pulling" out or "cutting" a callus off her finger. Pt states it has been sore for several months and the pain is to much now. Right middle appears to be swollen. No drainage noted. Pain 9/10

## 2018-12-10 NOTE — ED Provider Notes (Signed)
[facility] EMERGENCY DEPARTMENT Provider Note   CSN: [id number] Arrival date & time: 12/10/18  [id number]     History   Chief Complaint Chief Complaint  Patient presents with  . Finger Injury    HPI Donna Mcclain is a 23 y.o. female who presents for evaluation of pain, redness, swelling to her right third digit that began about 2 days ago.  She reports that 3 days ago, she noticed a callus on her volar aspect of her third digit.  She states she tried removing it with tweezers but states that nothing came out.  She states that since then, she has had worsening pain, redness and swelling to the finger.  She has not noticed any drainage from the finger.  She states she has not noted any fevers.  She states pain is 9/10 and worse when she attempts to move the finger.  She states that she holds in 1 position, her pain is somewhat improved.  She reports some tingling sensation on the distal aspect of the finger.     The history is provided by the patient.    Past Medical History:  Diagnosis Date  . Constipation   . Hypertension   . Seasonal allergies     Patient Active Problem List   Diagnosis Date Noted  . Migraine without aura and without status migrainosus, not intractable 07/09/2012  . Tension headache 07/09/2012  . Acute abdominal pain 03/24/2011  . ERYTHRASMA 05/03/2010  . BREAST PAIN, BILATERAL 05/03/2010  . HIDRADENITIS SUPPURATIVA 02/08/2009    History reviewed. No pertinent surgical history.   OB History   No obstetric history on file.      Home Medications    Prior to Admission medications   Medication Sig Start Date End Date Taking? Authorizing Provider  acetaminophen (TYLENOL) 500 MG tablet Take 500 mg by mouth every 6 (six) hours as needed for headache.     [provider]  Aspirin-Salicylamide-Caffeine (BC HEADACHE POWDER PO) Take 1 packet by mouth once.    [provider]  benzonatate (TESSALON) 100 MG capsule Take 1  capsule (100 mg total) by mouth 3 (three) times daily as needed for cough. 05/25/18   Fawze, Mina A, PA-C  doxycycline (VIBRAMYCIN) 100 MG capsule Take 1 capsule (100 mg total) by mouth 2 (two) times daily for 7 days. 12/10/18 12/17/18  Maxwell CaulLayden, Lindsey A, PA-C  fluticasone (FLONASE) 50 MCG/ACT nasal spray Place 2 sprays into both nostrils daily. 05/25/18   Fawze, Mina A, PA-C  guaiFENesin (MUCINEX) 600 MG 12 hr tablet Take 1 tablet (600 mg total) by mouth 2 (two) times daily. 01/12/18   Couture, Cortni S, PA-C  ibuprofen (ADVIL,MOTRIN) 200 MG tablet Take 200 mg by mouth every 6 (six) hours as needed for fever or moderate pain.    [provider]  meloxicam (MOBIC) 15 MG tablet Take 1 tablet (15 mg total) by mouth daily. Patient not taking: Reported on 12/04/2016 09/26/16   Bethel BornGekas, Kelly Marie, PA-C  ondansetron (ZOFRAN ODT) 4 MG disintegrating tablet Take 1 tablet (4 mg total) by mouth every 8 (eight) hours as needed for nausea or vomiting. 05/25/18   Fawze, Mina A, PA-C  ondansetron (ZOFRAN) 4 MG tablet Take 1 tablet (4 mg total) by mouth every 8 (eight) hours as needed for nausea or vomiting. Patient not taking: Reported on 09/26/2016 08/20/16   Tilden Fossaees, Elizabeth, MD    Family History Family History  Problem Relation Age of Onset  . Hypertension  Father   . Migraines Sister     Social History Social History   Tobacco Use  . Smoking status: Current Every Day Smoker    Packs/day: 1.00    Types: Cigarettes  . Smokeless tobacco: Never Used  Substance Use Topics  . Alcohol use: No  . Drug use: No     Allergies   Cephalosporins and Penicillins   Review of Systems Review of Systems  Constitutional: Negative for fever.  Skin: Positive for color change and wound.  Neurological: Positive for numbness.  All other systems reviewed and are negative.    Physical Exam Updated Vital Signs BP 132/87 (BP Location: Right Arm)   Pulse 72   Temp 99 F (37.2 C) (Oral)   Resp 15   SpO2 100%    Physical Exam Vitals signs and nursing note reviewed.  Constitutional:      Appearance: She is well-developed.  HENT:     Head: Normocephalic and atraumatic.  Eyes:     General: No scleral icterus.       Right eye: No discharge.        Left eye: No discharge.     Conjunctiva/sclera: Conjunctivae normal.  Cardiovascular:     Pulses:          Radial pulses are 2+ on the right side and 2+ on the left side.  Pulmonary:     Effort: Pulmonary effort is normal.  Musculoskeletal:     Comments: Right third digit is held in slight flexion.  She tenderness palpation along the volar aspect of the digit.  There is some swelling noted diffusely to the right third digit but not does appear to be a sausage digit and some redness that extends just distal to the PIP and extends over the DIP into the pad of the finger.  There is a small wound that is not actively draining noted along the joint line of the DIP.  Flexion and extension able to be performed but with subjective pain.  All other digits with full range of motion without any difficulty.  Skin:    General: Skin is warm and dry.     Capillary Refill: Capillary refill takes less than 2 seconds.     Comments: Good distal cap refill. RUE is not dusky in appearance or cool to touch.  Neurological:     Mental Status: She is alert.  Psychiatric:        Speech: Speech normal.        Behavior: Behavior normal.          ED Treatments / Results  Labs (all labs ordered are listed, but only abnormal results are displayed) Labs Reviewed  I-STAT BETA HCG BLOOD, ED ([facility], [facility], [facility] ONLY)    EKG None  Radiology No results found.  Procedures Procedures (including critical care time)  Medications Ordered in ED Medications - No data to display   Initial Impression / Assessment and Plan / ED Course  I have reviewed the triage vital signs and the nursing notes.  Pertinent labs & imaging results that were available during my care of the patient  were reviewed by me and considered in my medical decision making (see chart for details).        22 year old female who presents for evaluation of pain, notes, swelling to right third digit that is been ongoing since she attempted to remove a callus.  She states she has not had any fever. Patient is afebrile, non-toxic appearing, sitting comfortably  on examination table. Vital signs reviewed and stable.  Exam, she does have some mild swelling noted to the right third digit with some overlying erythema noted to the volar aspect.  He is holding it slightly in flexion he has no pain when I flex or extend the finger.  No active drainage.  Question if this is cellulitis versus early flexor tenosynovitis.  She is holding it in a slightly flexed position but does not appear like a sausage digit.  Additionally, she has no fever.  Discussed with Earney HamburgMichael Jeffrey (Ortho PA).  He will evaluate patient.  Discussed with Earney HamburgMichael Jeffrey after evaluation of the patient.  Exam not concerning for flexor tenosynovitis.  We will plan for antibiotics and outpatient follow-up.   Updated patient on plan.  She is agreeable.  Patient with allergy to cephalosporins, penicillins.  She has tolerated doxycycline in the past. At this time, patient exhibits no emergent life-threatening condition that require further evaluation in ED or admission. Patient had ample opportunity for questions and discussion. All patient's questions were answered with full understanding. Strict return precautions discussed. Patient expresses understanding and agreement to plan.   Portions of this note were generated with Scientist, clinical (histocompatibility and immunogenetics)Dragon dictation software. Dictation errors may occur despite best attempts at proofreading.   Final Clinical Impressions(s) / ED Diagnoses   Final diagnoses:  Injury of finger of left hand, initial encounter  Cellulitis of finger of right hand    ED Discharge Orders         Ordered    doxycycline (VIBRAMYCIN) 100 MG capsule   2 times daily     12/10/18 0932           Maxwell CaulLayden, Lindsey A, PA-C 12/10/18 1118    Sabas SousBero, Michael M, MD 12/13/18 1106

## 2018-12-10 NOTE — Discharge Instructions (Addendum)
You can take Tylenol or Ibuprofen as directed for pain. You can alternate Tylenol and Ibuprofen every 4 hours. If you take Tylenol at 1pm, then you can take Ibuprofen at 5pm. Then you can take Tylenol again at 9pm.   Take antibiotics as directed. Please take all of your antibiotics until finished.  Follow-up with [facility] to establish a primary care doctor if you do not have one.   Return emergency department for any worsening redness or swelling that extends beyond the finger, fevers or any other worsening or concerning symptoms.

## 2018-12-10 NOTE — Consult Note (Signed)
Reason for Consult:Right middle finger infection Referring Physician: Viona Gilmore  Donna Mcclain is an 23 y.o. female.  HPI: Jahmiyah had removed a callus or wart from her middle finger 3 days ago. It began to swell and get painful and has continued to do so so she came to the ED for evaluation. She denies fevers, chills, sweats, N/V or prior hx/o similar. 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  Past Medical History:  Diagnosis Date  . Constipation   . Hypertension   . Seasonal allergies     History reviewed. No pertinent surgical history.  Family History  Problem Relation Age of Onset  . Hypertension Father   . Migraines Sister     Social History:  reports that she has been smoking cigarettes. She has been smoking about 1.00 pack per day. She has never used smokeless tobacco. She reports that she does not drink alcohol or use drugs.  Allergies:  Allergies  Allergen Reactions  . Cephalosporins Hives  . Penicillins Rash    Patient had as a child Has patient had a PCN reaction causing immediate rash, facial/tongue/throat swelling, SOB or lightheadedness with hypotension: no Has patient had a PCN reaction causing severe rash involving mucus membranes or skin necrosis: no Has patient had a PCN reaction that required hospitalization no Has patient had a PCN reaction occurring within the last 10 years: no If all of the above answers are "NO", then may proceed with Cephalosporin use.    Medications: I have reviewed the patient's current medications.  No results found for this or any previous visit (from the past 48 hour(s)).  No results found.  Review of Systems  Constitutional: Negative for chills, fever and weight loss.  HENT: Negative for ear discharge, ear pain, hearing loss and tinnitus.   Eyes: Negative for blurred vision, double vision, photophobia and pain.  Respiratory: Negative for cough, sputum production and shortness of breath.   Cardiovascular: Negative for chest  pain.  Gastrointestinal: Negative for abdominal pain, nausea and vomiting.  Genitourinary: Negative for dysuria, flank pain, frequency and urgency.  Musculoskeletal: Positive for joint pain (Right middle finger). Negative for back pain, falls, myalgias and neck pain.  Neurological: Negative for dizziness, tingling, sensory change, focal weakness, loss of consciousness and headaches.  Endo/Heme/Allergies: Does not bruise/bleed easily.  Psychiatric/Behavioral: Negative for depression, memory loss and substance abuse. The patient is not nervous/anxious.    Blood pressure (!) 124/91, pulse 78, temperature 97.9 F (36.6 C), temperature source Oral, resp. rate 14, SpO2 100 %. Physical Exam  Constitutional: She appears well-developed and well-nourished. No distress.  HENT:  Head: Normocephalic and atraumatic.  Eyes: Conjunctivae are normal. Right eye exhibits no discharge. Left eye exhibits no discharge. No scleral icterus.  Neck: Normal range of motion.  Cardiovascular: Normal rate and regular rhythm.  Respiratory: Effort normal. No respiratory distress.  Musculoskeletal:     Comments: Right shoulder, elbow, wrist, digits- no skin wounds, mod TTP P2, mild pain with PROM DIP joint, able to straighten DIP joint, no instability, no blocks to motion  Sens  Ax/R/M/U intact, distal middle finger paresthetic  Mot   Ax/ R/ PIN/ M/ AIN/ U intact  Rad 2+  Neurological: She is alert.  Skin: Skin is warm and dry. She is not diaphoretic.  Psychiatric: She has a normal mood and affect. Her behavior is normal.    Assessment/Plan: Right middle finger cellulitis -- No e/o flexor tenosynovitis or abscess. I think this is a simple  cellulitis and can be treated with oral abx. She should f/u with Dr. Merlyn LotKuzma early next week if this is not responding to treatment, otherwise f/u can be prn.    Freeman CaldronMichael J. Denis Koppel, PA-C Orthopedic Surgery [phone] 12/10/2018, 9:21 AM

## 2018-12-24 ENCOUNTER — Emergency Department (HOSPITAL_COMMUNITY)
Admission: EM | Admit: 2018-12-24 | Discharge: 2018-12-24 | Disposition: A | Discharge status: Home / Self Care | Payer: Self-pay | Attending: Emergency Medicine | Admitting: Emergency Medicine

## 2018-12-24 ENCOUNTER — Emergency Department (HOSPITAL_COMMUNITY): Payer: Self-pay

## 2018-12-24 ENCOUNTER — Other Ambulatory Visit: Payer: Self-pay

## 2018-12-24 ENCOUNTER — Encounter (HOSPITAL_COMMUNITY): Payer: Self-pay | Admitting: *Deleted

## 2018-12-24 DIAGNOSIS — Z79899 Other long term (current) drug therapy: Secondary | ICD-10-CM | POA: Insufficient documentation

## 2018-12-24 DIAGNOSIS — I10 Essential (primary) hypertension: Secondary | ICD-10-CM | POA: Insufficient documentation

## 2018-12-24 DIAGNOSIS — L02511 Cutaneous abscess of right hand: Secondary | ICD-10-CM | POA: Insufficient documentation

## 2018-12-24 DIAGNOSIS — F17210 Nicotine dependence, cigarettes, uncomplicated: Secondary | ICD-10-CM | POA: Insufficient documentation

## 2018-12-24 MED ORDER — DOXYCYCLINE HYCLATE 100 MG PO CAPS: 100.0000 mg | ORAL_CAPSULE | Freq: Two times a day (BID) | ORAL | 0 refills | Status: DC

## 2018-12-24 NOTE — ED Notes (Signed)
Pt dc'd home w/all belongings, a/o x4, ambulatory on dc  

## 2018-12-24 NOTE — Discharge Instructions (Addendum)
Take doxycycline as prescribed and complete the full course.  Soak hand in warm Epson salt water and move your finger all the way in and out.  Call ortho today to arrange follow up.

## 2018-12-24 NOTE — ED Provider Notes (Signed)
[facility] Provider Note   CSN: [id number] Arrival date & time: 12/24/18  [id number]     History   Chief Complaint Chief Complaint  Patient presents with  . Hand Pain    HPI Donna Mcclain is a 23 y.o. female.     23 year old female presents with complaint of ongoing infection concern in her right third finger.  Patient was seen here on August 21 for same, given prescription for doxycycline which she completed, was also referred to Ortho at that time.  Patient reports area opened and drained a few days ago, has blister in the area now and is concerned she has additional infection.  Patient is right-hand dominant, no pain with movement of the MCP or PIP, does have pain with movement of the DIP.  No injuries to this finger.  No other complaints or concerns.  Patient has not followed up with hand specialist as of yet.     Past Medical History:  Diagnosis Date  . Constipation   . Hypertension   . Seasonal allergies     Patient Active Problem List   Diagnosis Date Noted  . Migraine without aura and without status migrainosus, not intractable 07/09/2012  . Tension headache 07/09/2012  . Acute abdominal pain 03/24/2011  . ERYTHRASMA 05/03/2010  . BREAST PAIN, BILATERAL 05/03/2010  . HIDRADENITIS SUPPURATIVA 02/08/2009    History reviewed. No pertinent surgical history.   OB History   No obstetric history on file.      Home Medications    Prior to Admission medications   Medication Sig Start Date End Date Taking? Authorizing Provider  acetaminophen (TYLENOL) 500 MG tablet Take 500 mg by mouth every 6 (six) hours as needed for headache.     [provider]  Aspirin-Salicylamide-Caffeine (BC HEADACHE POWDER PO) Take 1 packet by mouth once.    [provider]  benzonatate (TESSALON) 100 MG capsule Take 1 capsule (100 mg total) by mouth 3 (three) times daily as needed for cough. 05/25/18   Fawze, Mina A, PA-C   doxycycline (VIBRAMYCIN) 100 MG capsule Take 1 capsule (100 mg total) by mouth 2 (two) times daily. 12/24/18   Jeannie FendMurphy, Yennifer Segovia A, PA-C  fluticasone (FLONASE) 50 MCG/ACT nasal spray Place 2 sprays into both nostrils daily. 05/25/18   Fawze, Mina A, PA-C  guaiFENesin (MUCINEX) 600 MG 12 hr tablet Take 1 tablet (600 mg total) by mouth 2 (two) times daily. 01/12/18   Couture, Cortni S, PA-C  ibuprofen (ADVIL,MOTRIN) 200 MG tablet Take 200 mg by mouth every 6 (six) hours as needed for fever or moderate pain.    [provider]  meloxicam (MOBIC) 15 MG tablet Take 1 tablet (15 mg total) by mouth daily. Patient not taking: Reported on 12/04/2016 09/26/16   Bethel BornGekas, Kelly Marie, PA-C  ondansetron (ZOFRAN ODT) 4 MG disintegrating tablet Take 1 tablet (4 mg total) by mouth every 8 (eight) hours as needed for nausea or vomiting. 05/25/18   Fawze, Mina A, PA-C  ondansetron (ZOFRAN) 4 MG tablet Take 1 tablet (4 mg total) by mouth every 8 (eight) hours as needed for nausea or vomiting. Patient not taking: Reported on 09/26/2016 08/20/16   Tilden Fossaees, Elizabeth, MD    Family History Family History  Problem Relation Age of Onset  . Hypertension Father   . Migraines Sister     Social History Social History   Tobacco Use  . Smoking status: Current Every Day Smoker    Packs/day: 1.00  Types: Cigarettes  . Smokeless tobacco: Never Used  Substance Use Topics  . Alcohol use: No  . Drug use: No     Allergies   Cephalosporins and Penicillins   Review of Systems Review of Systems  Constitutional: Negative for fever.  Musculoskeletal: Positive for arthralgias. Negative for myalgias.  Skin: Positive for wound.  Allergic/Immunologic: Negative for immunocompromised state.  Neurological: Negative for numbness.  All other systems reviewed and are negative.    Physical Exam Updated Vital Signs BP 113/71 (BP Location: Right Arm)   Pulse 84   Temp 98.7 F (37.1 C) (Oral)   Resp 15   Ht 5\' 2"  (1.575 m)   Wt  74.8 kg   LMP 11/25/2018   SpO2 100%   BMI 30.18 kg/m   Physical Exam Vitals signs and nursing note reviewed.  Constitutional:      General: She is not in acute distress.    Appearance: She is well-developed. She is not diaphoretic.  HENT:     Head: Normocephalic and atraumatic.  Cardiovascular:     Pulses: Normal pulses.  Pulmonary:     Effort: Pulmonary effort is normal.  Musculoskeletal:        General: Tenderness present. No deformity.       Hands:  Skin:    General: Skin is warm and dry.     Findings: No erythema.  Neurological:     Mental Status: She is alert and oriented to person, place, and time.     Sensory: No sensory deficit.  Psychiatric:        Behavior: Behavior normal.     Media Information    Document Information  Photos    12/24/2018 09:12  Attached To:  Hospital Encounter on 12/24/18  Source Information  [facility]    ED Treatments / Results  Labs (all labs ordered are listed, but only abnormal results are displayed) Labs Reviewed - No data to display  EKG None  Radiology Dg Finger Middle Right  Result Date: 12/24/2018 CLINICAL DATA:  RIGHT middle finger swelling for 2 weeks, especially pain at DIP joint EXAM: RIGHT MIDDLE FINGER 2+V COMPARISON:  None FINDINGS: Mild soft tissue swelling at distal aspect of RIGHT middle finger. Osseous mineralization normal. Joint spaces preserved. Tiny well-formed ossicle at PIP joint. No fracture, dislocation or bone destruction. IMPRESSION: No acute osseous abnormalities. Electronically Signed   By: Lavonia Dana M.D.   On: 12/24/2018 09:02    Procedures .Marland KitchenIncision and Drainage  Date/Time: 12/24/2018 9:54 AM Performed by: Tacy Learn, PA-C Authorized by: Tacy Learn, PA-C   Consent:    Consent obtained:  Verbal   Consent given by:  Patient   Risks discussed:  Bleeding, incomplete drainage, pain and damage to other organs   Alternatives discussed:  No treatment  Universal protocol:    Procedure explained and questions answered to patient or proxy's satisfaction: yes     Relevant documents present and verified: yes     Test results available and properly labeled: yes     Imaging studies available: yes     Required blood products, implants, devices, and special equipment available: yes     Site/side marked: yes     Immediately prior to procedure a time out was called: yes     Patient identity confirmed:  Verbally with patient Location:    Type:  Abscess Pre-procedure details:    Skin preparation:  Chloraprep Anesthesia (see MAR for exact dosages):  Anesthesia method:  None Procedure type:    Complexity:  Simple Procedure details:    Incision types:  Stab incision   Incision depth:  Subcutaneous   Scalpel size: 18g.   Drainage:  Purulent   Drainage amount:  Scant   Wound treatment:  Wound left open   Packing materials:  None Post-procedure details:    Patient tolerance of procedure:  Tolerated well, no immediate complications   (including critical care time)  Medications Ordered in ED Medications - No data to display   Initial Impression / Assessment and Plan / ED Course  I have reviewed the triage vital signs and the nursing notes.  Pertinent labs & imaging results that were available during my care of the patient were reviewed by me and considered in my medical decision making (see chart for details).  Clinical Course as of Dec 23 953  Fri Dec 24, 2018  1215 23 year old female returns emergency room with right third finger pain, concern for infection.  Patient completed 10-day course of doxycycline as the area seem to be improving, opened and drained purulent material.  Reports blister to her finger which she is concerned may contain pus.  On exam patient has a small fluctuant area noted to the palmar aspect of the right third finger at the DIP.  Area was opened with 18-gauge needle with purulent material expressed.  No pain with  range of motion active or passive at the MCP or PIP, do not suspect flexor tenosynovitis at this time.  Area has been opened and drained, patient replaced back on doxycycline (allergies to cephalosporins and penicillins), x-ray negative for osteomyelitis or foreign body.  Patient was referred back to hand Ortho, advised to call the office today to schedule an appointment.  Also recommend patient soak her hand in warm Epson salt water and work through range of motion of all fingers soaking.   [LM]    Clinical Course User Index [LM] Jeannie Fend, PA-C      Final Clinical Impressions(s) / ED Diagnoses   Final diagnoses:  Abscess of finger of right hand    ED Discharge Orders         Ordered    doxycycline (VIBRAMYCIN) 100 MG capsule  2 times daily     12/24/18 0919           Jeannie Fend, PA-C 12/24/18 1021    Gerhard Munch, MD 12/24/18 1100

## 2018-12-24 NOTE — ED Triage Notes (Signed)
Pt has finished antibiotics for infection to R middle finger.  This am woke up and swelling and pain have increased.

## 2018-12-27 ENCOUNTER — Ambulatory Visit (HOSPITAL_COMMUNITY)
Admission: EM | Admit: 2018-12-27 | Discharge: 2018-12-27 | Disposition: A | Discharge status: Home / Self Care | Payer: Medicaid Other | Attending: Family Medicine | Admitting: Family Medicine

## 2018-12-27 ENCOUNTER — Other Ambulatory Visit: Payer: Self-pay

## 2018-12-27 ENCOUNTER — Encounter (HOSPITAL_COMMUNITY): Payer: Self-pay

## 2018-12-27 DIAGNOSIS — L239 Allergic contact dermatitis, unspecified cause: Secondary | ICD-10-CM

## 2018-12-27 MED ORDER — PREDNISONE 10 MG (21) PO TBPK: ORAL_TABLET | ORAL | 0 refills | Status: DC

## 2018-12-27 NOTE — ED Provider Notes (Signed)
[place]    CSN: [id number] Arrival date & time: 12/27/18  2836      History   Chief Complaint Chief Complaint  Patient presents with  . Rash    HPI Donna Mcclain is a 23 y.o. female.   Patient is a 23 year old female past no history of hypertension, seasonal allergies.  She presents today with generalized rash to face.  The rash is pruritic.  Symptoms have been constant over the past 2 days.  She did take Benadryl for the rash that helped some.  The only thing new that she can think of is she did go swimming in the river approximately 2 days prior to this starting.  She was on doxycycline 2 weeks ago and finished that course with no problems.  She has had allergic reactions to antibiotics in the past but those were typically generalized hives.  No trouble breathing or swallowing.   Denies any fever, joint pain. Denies any recent changes in lotions, detergents, foods or other possible irritants. No recent travel. Nobody else at home has the rash. Patient has been outside but denies any contact with plants or insects. No new foods or medications.   ROS per HPI        Past Medical History:  Diagnosis Date  . Constipation   . Hypertension   . Seasonal allergies     Patient Active Problem List   Diagnosis Date Noted  . Migraine without aura and without status migrainosus, not intractable 07/09/2012  . Tension headache 07/09/2012  . Acute abdominal pain 03/24/2011  . ERYTHRASMA 05/03/2010  . BREAST PAIN, BILATERAL 05/03/2010  . HIDRADENITIS SUPPURATIVA 02/08/2009    History reviewed. No pertinent surgical history.  OB History   No obstetric history on file.      Home Medications    Prior to Admission medications   Medication Sig Start Date End Date Taking? Authorizing Provider  doxycycline (VIBRAMYCIN) 100 MG capsule Take 1 capsule (100 mg total) by mouth 2 (two) times daily. 12/24/18  Yes Tacy Learn, PA-C  acetaminophen (TYLENOL) 500 MG  tablet Take 500 mg by mouth every 6 (six) hours as needed for headache.     [provider]  Aspirin-Salicylamide-Caffeine (BC HEADACHE POWDER PO) Take 1 packet by mouth once.    [provider]  benzonatate (TESSALON) 100 MG capsule Take 1 capsule (100 mg total) by mouth 3 (three) times daily as needed for cough. 05/25/18   Fawze, Mina A, PA-C  fluticasone (FLONASE) 50 MCG/ACT nasal spray Place 2 sprays into both nostrils daily. 05/25/18   Fawze, Mina A, PA-C  guaiFENesin (MUCINEX) 600 MG 12 hr tablet Take 1 tablet (600 mg total) by mouth 2 (two) times daily. 01/12/18   Couture, Cortni S, PA-C  ibuprofen (ADVIL,MOTRIN) 200 MG tablet Take 200 mg by mouth every 6 (six) hours as needed for fever or moderate pain.    [provider]  ondansetron (ZOFRAN ODT) 4 MG disintegrating tablet Take 1 tablet (4 mg total) by mouth every 8 (eight) hours as needed for nausea or vomiting. 05/25/18   Fawze, Mina A, PA-C  predniSONE (STERAPRED UNI-PAK 21 TAB) 10 MG (21) TBPK tablet 6 tabs for 1 day, then 5 tabs for 1 das, then 4 tabs for 1 day, then 3 tabs for 1 day, 2 tabs for 1 day, then 1 tab for 1 day 12/27/18   Orvan July, NP    Family History Family History  Problem Relation Age  of Onset  . Healthy Mother   . Hypertension Father   . Migraines Sister     Social History Social History   Tobacco Use  . Smoking status: Current Every Day Smoker    Packs/day: 0.50    Types: Cigarettes  . Smokeless tobacco: Never Used  Substance Use Topics  . Alcohol use: No  . Drug use: No     Allergies   Cephalosporins and Penicillins   Review of Systems Review of Systems   Physical Exam Triage Vital Signs ED Triage Vitals  Enc Vitals Group     BP 12/27/18 0936 116/78     Pulse Rate 12/27/18 0936 82     Resp 12/27/18 0936 15     Temp 12/27/18 0936 98.6 F (37 C)     Temp Source 12/27/18 0936 Oral     SpO2 12/27/18 0936 100 %     Weight --      Height --      Head Circumference  --      Peak Flow --      Pain Score 12/27/18 0934 0     Pain Loc --      Pain Edu? --      Excl. in GC? --    No data found.  Updated Vital Signs BP 116/78 (BP Location: Left Arm)   Pulse 82   Temp 98.6 F (37 C) (Oral)   Resp 15   SpO2 100%   Visual Acuity Right Eye Distance:   Left Eye Distance:   Bilateral Distance:    Right Eye Near:   Left Eye Near:    Bilateral Near:     Physical Exam Vitals signs and nursing note reviewed.  Constitutional:      General: She is not in acute distress.    Appearance: Normal appearance. She is not ill-appearing, toxic-appearing or diaphoretic.  HENT:     Head: Normocephalic.     Nose: Nose normal.     Mouth/Throat:     Pharynx: Oropharynx is clear.  Eyes:     Conjunctiva/sclera: Conjunctivae normal.  Neck:     Musculoskeletal: Normal range of motion.  Pulmonary:     Effort: Pulmonary effort is normal.  Musculoskeletal: Normal range of motion.  Skin:    General: Skin is warm and dry.     Findings: Rash present.     Comments: Generalized papular rash to entire face.  Neurological:     Mental Status: She is alert.  Psychiatric:        Mood and Affect: Mood normal.      UC Treatments / Results  Labs (all labs ordered are listed, but only abnormal results are displayed) Labs Reviewed - No data to display  EKG   Radiology No results found.  Procedures Procedures (including critical care time)  Medications Ordered in UC Medications - No data to display  Initial Impression / Assessment and Plan / UC Course  I have reviewed the triage vital signs and the nursing notes.  Pertinent labs & imaging results that were available during my care of the patient were reviewed by me and considered in my medical decision making (see chart for details).     Contact dermatitis-rash most consistent with some sort of contact irritant. Will treat with prednisone taper over the next 6 days. Benadryl as needed for itching  Follow up as needed for continued or worsening symptoms  Final Clinical Impressions(s) / UC Diagnoses   Final diagnoses:  Allergic contact dermatitis, unspecified trigger     Discharge Instructions     I believe this rash is caused by some sort of contact irritant. Prednisone daily for the next 6 days.  Make sure you take this with food. Follow up as needed for continued or worsening symptoms     ED Prescriptions    Medication Sig Dispense Auth. Provider   predniSONE (STERAPRED UNI-PAK 21 TAB) 10 MG (21) TBPK tablet 6 tabs for 1 day, then 5 tabs for 1 das, then 4 tabs for 1 day, then 3 tabs for 1 day, 2 tabs for 1 day, then 1 tab for 1 day 21 tablet Jaci LazierBast, Yomaira Solar A, NP     Controlled Substance Prescriptions Man Controlled Substance Registry consulted? Not Applicable   Janace ArisBast, Sanah Kraska A, NP 12/27/18 1226

## 2018-12-27 NOTE — ED Triage Notes (Signed)
Patient presents to Urgent Care with complaints of itchy rash on her face since yesterday. Patient reports she has not tried any new lotions, washes, or detergents. Pt states she has been on an antibiotic for a finger infection, not sure if that could be the cause.

## 2018-12-27 NOTE — Discharge Instructions (Signed)
I believe this rash is caused by some sort of contact irritant. Prednisone daily for the next 6 days.  Make sure you take this with food. Follow up as needed for continued or worsening symptoms

## 2019-02-02 ENCOUNTER — Other Ambulatory Visit: Payer: Self-pay

## 2019-02-02 DIAGNOSIS — Z20822 Contact with and (suspected) exposure to covid-19: Secondary | ICD-10-CM

## 2019-02-04 LAB — NOVEL CORONAVIRUS, NAA: SARS-CoV-2, NAA: NOT DETECTED

## 2019-03-27 ENCOUNTER — Ambulatory Visit (HOSPITAL_COMMUNITY)
Admission: EM | Admit: 2019-03-27 | Discharge: 2019-03-27 | Disposition: A | Discharge status: Home / Self Care | Payer: HRSA Program | Attending: Emergency Medicine | Admitting: Emergency Medicine

## 2019-03-27 ENCOUNTER — Other Ambulatory Visit: Payer: Self-pay

## 2019-03-27 ENCOUNTER — Encounter (HOSPITAL_COMMUNITY): Payer: Self-pay

## 2019-03-27 DIAGNOSIS — Z720 Tobacco use: Secondary | ICD-10-CM | POA: Diagnosis not present

## 2019-03-27 DIAGNOSIS — J988 Other specified respiratory disorders: Secondary | ICD-10-CM | POA: Insufficient documentation

## 2019-03-27 DIAGNOSIS — Z20822 Contact with and (suspected) exposure to covid-19: Secondary | ICD-10-CM

## 2019-03-27 DIAGNOSIS — Z20828 Contact with and (suspected) exposure to other viral communicable diseases: Secondary | ICD-10-CM | POA: Diagnosis not present

## 2019-03-27 DIAGNOSIS — B9789 Other viral agents as the cause of diseases classified elsewhere: Secondary | ICD-10-CM | POA: Insufficient documentation

## 2019-03-27 NOTE — Discharge Instructions (Addendum)
Take 600 mg ibuprofen with 1 g of Tylenol 3-4 times a day as needed for headache.  Push extra fluids.  Continue Flonase.  May add Mucinex as needed for the nasal congestion.  Saline nasal irrigation with a Milta Deiters med sinus rinse and distilled water as often as you want.  Covid test will be back in 24 to 48 hours. Below is a list of primary care practices who are taking new patients for you to follow-up with.  [facility] at [facility] [address] [place], [place] [address] [phone]  [place], [place] [address] [phone]  [facility] [phone] [place] [address]  [facility]: [place]  [phone]  [facility]: [place]   [phone]  [facility]: [address] [place]  [phone]  [place] [facility] : [address] [place] [phone]  [facility] Primary [facility]: [address] [place] [address] [phone]  [facility]: [place] [phone]  Dr. Blanchie Serve [place]  [phone]  Dr. Benito Mccreedy, [facility]. [place], [place] [address]  [phone]  Go to www.goodrx.com to look up your medications. This will give you a list of where you can find your prescriptions at the most affordable prices. Or ask the pharmacist what the cash price is, or if they have any other discount programs available to help make your medication more affordable. This can be less expensive than what you would pay with insurance.

## 2019-03-27 NOTE — ED Provider Notes (Signed)
HPI  SUBJECTIVE:  Donna Mcclain is a 23 y.o. female who presents for Covid testing.  states that both her sister and her sister's boyfriend have tested positive for Covid after going on a family beach trip.  She states that 5 out of 16 people tested positive for Covid after this trip.  Last exposure was about 5 to 6 days ago.  She reports nasal congestion, headache, cough.  Headaches are not particularly different or worse than her usual headaches.  No fevers, sore throat, body aches, loss of sense of smell or taste, shortness of breath, new or different nausea, vomiting, abdominal pain.  She had 1-2 episodes of diarrhea 2 days ago, this has resolved.  She states overall she feels "okay".  No antipyretic in the past 4 to 6 hours.  She has been taking ibuprofen 400 to 600 mg every 6 hours as needed with improvement in her symptoms.  No aggravating factors.  She has a past medical history of hypertension, migraines.  She is a smoker.  No history of pulmonary disease, coronary artery disease, diabetes chronic kidney disease, immunocompromise, cancer.  LMP: 11/20.  Denies the possibility of being pregnant.  PMD: None.  Past Medical History:  Diagnosis Date  . Constipation   . Hypertension   . Seasonal allergies     History reviewed. No pertinent surgical history.  Family History  Problem Relation Age of Onset  . Healthy Mother   . Hypertension Father   . Migraines Sister     Social History   Tobacco Use  . Smoking status: Current Every Day Smoker    Packs/day: 0.50    Types: Cigarettes  . Smokeless tobacco: Never Used  . Tobacco comment: smokes inside w/ children  Substance Use Topics  . Alcohol use: No  . Drug use: No    No current facility-administered medications for this encounter.   Current Outpatient Medications:  .  acetaminophen (TYLENOL) 500 MG tablet, Take 500 mg by mouth every 6 (six) hours as needed for headache. , Disp: , Rfl:  .  Aspirin-Salicylamide-Caffeine  (BC HEADACHE POWDER PO), Take 1 packet by mouth once., Disp: , Rfl:  .  benzonatate (TESSALON) 100 MG capsule, Take 1 capsule (100 mg total) by mouth 3 (three) times daily as needed for cough., Disp: 21 capsule, Rfl: 0 .  fluticasone (FLONASE) 50 MCG/ACT nasal spray, Place 2 sprays into both nostrils daily., Disp: 16 g, Rfl: 0 .  guaiFENesin (MUCINEX) 600 MG 12 hr tablet, Take 1 tablet (600 mg total) by mouth 2 (two) times daily., Disp: 6 tablet, Rfl: 0 .  ibuprofen (ADVIL,MOTRIN) 200 MG tablet, Take 200 mg by mouth every 6 (six) hours as needed for fever or moderate pain., Disp: , Rfl:  .  ondansetron (ZOFRAN ODT) 4 MG disintegrating tablet, Take 1 tablet (4 mg total) by mouth every 8 (eight) hours as needed for nausea or vomiting., Disp: 10 tablet, Rfl: 0 .  predniSONE (STERAPRED UNI-PAK 21 TAB) 10 MG (21) TBPK tablet, 6 tabs for 1 day, then 5 tabs for 1 das, then 4 tabs for 1 day, then 3 tabs for 1 day, 2 tabs for 1 day, then 1 tab for 1 day, Disp: 21 tablet, Rfl: 0  Allergies  Allergen Reactions  . Cephalosporins Hives  . Penicillins Rash    Patient had as a child Has patient had a PCN reaction causing immediate rash, facial/tongue/throat swelling, SOB or lightheadedness with hypotension: no Has patient had a PCN reaction  causing severe rash involving mucus membranes or skin necrosis: no Has patient had a PCN reaction that required hospitalization no Has patient had a PCN reaction occurring within the last 10 years: no If all of the above answers are "NO", then may proceed with Cephalosporin use.     ROS  As noted in HPI.   Physical Exam  BP 120/79 (BP Location: Left Arm)   Pulse 89   Temp 98.5 F (36.9 C) (Oral)   Resp 15   SpO2 100%   Constitutional: Well developed, well nourished, no acute distress Eyes: PERRL, EOMI, conjunctiva normal bilaterally HENT: Normocephalic, atraumatic,mucus membranes moist.  Mild nasal congestion.  No sinus tenderness.  Normal oropharynx, normal  tonsils without exudates, uvula midline.  No obvious postnasal drip Neck: No meningismus, cervical lymphadenopathy Respiratory: Clear to auscultation bilaterally, no rales, no wheezing, no rhonchi Cardiovascular: Normal rate and rhythm, no murmurs, no gallops, no rubs GI: Nondistended skin: No rash, skin intact Musculoskeletal: No edema, no tenderness, no deformities Neurologic: Alert & oriented x 3, CN III-XII grossly intact, no motor deficits, sensation grossly intact Psychiatric: Speech and behavior appropriate   ED Course   Medications - No data to display  Orders Placed This Encounter  Procedures  . Novel Coronavirus, NAA (Hosp order, Send-out to Ref Lab; TAT 18-24 hrs    Standing Status:   Standing    Number of Occurrences:   1    Order Specific Question:   Is this test for diagnosis or screening    Answer:   Screening    Order Specific Question:   Symptomatic for COVID-19 as defined by CDC    Answer:   No    Order Specific Question:   Hospitalized for COVID-19    Answer:   No    Order Specific Question:   Admitted to ICU for COVID-19    Answer:   No    Order Specific Question:   Previously tested for COVID-19    Answer:   Yes    Order Specific Question:   Resident in a congregate (group) care setting    Answer:   No    Order Specific Question:   Employed in healthcare setting    Answer:   No    Order Specific Question:   Pregnant    Answer:   No   No results found for this or any previous visit (from the past 24 hour(s)). No results found.  ED Clinical Impression  1. Close exposure to COVID-19 virus   2. Viral respiratory infection      ED Assessment/Plan   Patient with close exposure to Covid.  Covid PCR sent.  She may continue ibuprofen and add Tylenol to it as needed for headaches.  She states she does not need a prescription for ibuprofen.  Will provide primary care list for routine care.  Also Covid work note.   Discussed labs,MDM, treatment plan, and  plan for follow-up with patient. patient agrees with plan.   No orders of the defined types were placed in this encounter.   *This clinic note was created using Dragon dictation software. Therefore, there may be occasional mistakes despite careful proofreading.  ?   Domenick Gong, MD 03/29/19 [phone]

## 2019-03-27 NOTE — ED Triage Notes (Signed)
Patient presents to Urgent Care with complaints of sore throat and headache since 4 days ago. Patient reports her niece, who she lives with tested positive for covid recently.

## 2019-03-28 LAB — NOVEL CORONAVIRUS, NAA (HOSP ORDER, SEND-OUT TO REF LAB; TAT 18-24 HRS): SARS-CoV-2, NAA: NOT DETECTED

## 2019-07-31 ENCOUNTER — Emergency Department (HOSPITAL_COMMUNITY): Payer: No Typology Code available for payment source

## 2019-07-31 ENCOUNTER — Encounter (HOSPITAL_COMMUNITY): Payer: Self-pay | Admitting: *Deleted

## 2019-07-31 ENCOUNTER — Emergency Department (HOSPITAL_COMMUNITY)
Admission: EM | Admit: 2019-07-31 | Discharge: 2019-07-31 | Disposition: A | Discharge status: Home / Self Care | Payer: No Typology Code available for payment source | Attending: Emergency Medicine | Admitting: Emergency Medicine

## 2019-07-31 ENCOUNTER — Other Ambulatory Visit: Payer: Self-pay

## 2019-07-31 DIAGNOSIS — Y93I9 Activity, other involving external motion: Secondary | ICD-10-CM | POA: Diagnosis not present

## 2019-07-31 DIAGNOSIS — Y999 Unspecified external cause status: Secondary | ICD-10-CM | POA: Diagnosis not present

## 2019-07-31 DIAGNOSIS — M25561 Pain in right knee: Secondary | ICD-10-CM | POA: Insufficient documentation

## 2019-07-31 DIAGNOSIS — F17210 Nicotine dependence, cigarettes, uncomplicated: Secondary | ICD-10-CM | POA: Insufficient documentation

## 2019-07-31 DIAGNOSIS — R519 Headache, unspecified: Secondary | ICD-10-CM | POA: Diagnosis not present

## 2019-07-31 DIAGNOSIS — I10 Essential (primary) hypertension: Secondary | ICD-10-CM | POA: Insufficient documentation

## 2019-07-31 DIAGNOSIS — M542 Cervicalgia: Secondary | ICD-10-CM | POA: Diagnosis present

## 2019-07-31 DIAGNOSIS — Y92410 Unspecified street and highway as the place of occurrence of the external cause: Secondary | ICD-10-CM | POA: Insufficient documentation

## 2019-07-31 DIAGNOSIS — Z79899 Other long term (current) drug therapy: Secondary | ICD-10-CM | POA: Insufficient documentation

## 2019-07-31 MED ORDER — NAPROXEN 500 MG PO TABS
500.0000 mg | ORAL_TABLET | Freq: Once | ORAL | Status: AC
  Administered 2019-07-31: 500 mg via ORAL
  Filled 2019-07-31: qty 1

## 2019-07-31 MED ORDER — CYCLOBENZAPRINE HCL 10 MG PO TABS
10.0000 mg | ORAL_TABLET | Freq: Once | ORAL | Status: AC
  Administered 2019-07-31: 13:00:00 10 mg via ORAL
  Filled 2019-07-31: qty 1

## 2019-07-31 MED ORDER — NAPROXEN 375 MG PO TABS: 375.0000 mg | ORAL_TABLET | Freq: Two times a day (BID) | ORAL | 0 refills | Status: AC

## 2019-07-31 MED ORDER — METHOCARBAMOL 500 MG PO TABS: 500.0000 mg | ORAL_TABLET | Freq: Two times a day (BID) | ORAL | 0 refills | Status: AC
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
## 2019-07-31 NOTE — Discharge Instructions (Signed)
I have prescribed muscle relaxers for your pain, please do not drink or drive while taking this medications as they can make you drowsy.  Please follow-up with PCP in 1 week for reevaluation of your symptoms.  You experience any bowel or bladder incontinence, fever, worsening in your symptoms please return to the ED. ° °

## 2019-12-12 ENCOUNTER — Emergency Department (HOSPITAL_COMMUNITY)
Admission: EM | Admit: 2019-12-12 | Discharge: 2019-12-12 | Discharge status: Against Medical Advice | Payer: Medicaid Other

## 2019-12-13 ENCOUNTER — Encounter (HOSPITAL_COMMUNITY): Payer: Self-pay

## 2019-12-13 ENCOUNTER — Ambulatory Visit (HOSPITAL_COMMUNITY)
Admission: EM | Admit: 2019-12-13 | Discharge: 2019-12-13 | Disposition: A | Discharge status: Home / Self Care | Payer: Medicaid Other | Attending: Emergency Medicine | Admitting: Emergency Medicine

## 2019-12-13 ENCOUNTER — Other Ambulatory Visit: Payer: Self-pay

## 2019-12-13 DIAGNOSIS — S199XXA Unspecified injury of neck, initial encounter: Secondary | ICD-10-CM

## 2019-12-13 MED ORDER — IBUPROFEN 800 MG PO TABS: 800.0000 mg | ORAL_TABLET | Freq: Three times a day (TID) | ORAL | 0 refills | Status: DC

## 2019-12-13 MED ORDER — CYCLOBENZAPRINE HCL 5 MG PO TABS: 5.0000 mg | ORAL_TABLET | Freq: Two times a day (BID) | ORAL | 0 refills | Status: DC | PRN
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
## 2019-12-13 NOTE — Discharge Instructions (Signed)
Use anti-inflammatories for pain/swelling. You may take up to 800 mg Ibuprofen every 8 hours with food. You may supplement Ibuprofen with Tylenol [phone] mg every 8 hours.  Alternate ice and heat Softer foods Monitor for gradual improvement, follow up if not improving or worsening
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
## 2020-01-13 ENCOUNTER — Encounter (HOSPITAL_COMMUNITY): Payer: Self-pay | Admitting: Emergency Medicine

## 2020-01-13 ENCOUNTER — Emergency Department (HOSPITAL_COMMUNITY)
Admission: EM | Admit: 2020-01-13 | Discharge: 2020-01-13 | Disposition: A | Discharge status: Home / Self Care | Payer: HRSA Program | Attending: Emergency Medicine | Admitting: Emergency Medicine

## 2020-01-13 DIAGNOSIS — F17210 Nicotine dependence, cigarettes, uncomplicated: Secondary | ICD-10-CM | POA: Diagnosis not present

## 2020-01-13 DIAGNOSIS — Z7982 Long term (current) use of aspirin: Secondary | ICD-10-CM | POA: Insufficient documentation

## 2020-01-13 DIAGNOSIS — I10 Essential (primary) hypertension: Secondary | ICD-10-CM | POA: Diagnosis not present

## 2020-01-13 DIAGNOSIS — J069 Acute upper respiratory infection, unspecified: Secondary | ICD-10-CM | POA: Diagnosis present

## 2020-01-13 DIAGNOSIS — U071 COVID-19: Secondary | ICD-10-CM | POA: Insufficient documentation

## 2020-01-13 DIAGNOSIS — Z20822 Contact with and (suspected) exposure to covid-19: Secondary | ICD-10-CM

## 2020-01-13 LAB — RESPIRATORY PANEL BY RT PCR (FLU A&B, COVID)
Influenza A by PCR: NEGATIVE
Influenza B by PCR: NEGATIVE
SARS Coronavirus 2 by RT PCR: POSITIVE — AB

## 2020-01-13 NOTE — Discharge Instructions (Signed)
You likely have a viral illness.  This should be treated symptomatically. Use Tylenol or ibuprofen as needed for fevers or body aches. Use cough drops as needed.  Make sure you stay well-hydrated with water. Wash your hands frequently to prevent spread of infection. You were tested for covid and flu today. If positive, you will receive a phone call. If negative, you will not. Either way, you can check online on MyChart for results.  Return to the emergency room if you develop chest pain, difficulty breathing, or any new or worsening symptoms.

## 2020-01-13 NOTE — ED Provider Notes (Signed)
[facility] EMERGENCY DEPARTMENT Provider Note   CSN: [id number] Arrival date & time: 01/13/20  0813     History Chief Complaint  Patient presents with  . URI    Donna Mcclain is a 24 y.o. female presenting for evaluation of cough, nasal congestion, loss of taste and smell, diarrhea.  Patient states her symptoms began 2 days ago.  She reports being around 24 who was sick last week, but they do not know if they were Covid positive.  She reports a nonproductive cough.  No associated shortness of breath or chest pain.  She denies fevers or chills.  She does have nasal congestion, and today lost sense of taste and smell.  She denies nausea, vomiting, abdominal pain.  She does report diarrhea, but also started her period today which often triggers diarrhea.  She has no other medical problems, takes no medications daily.  She has not been taking anything for her symptoms.  She has been vaccinated for Covid.  HPI     Past Medical History:  Diagnosis Date  . Constipation   . Hypertension   . Seasonal allergies     Patient Active Problem List   Diagnosis Date Noted  . Migraine without aura and without status migrainosus, not intractable 07/09/2012  . Tension headache 07/09/2012  . Acute abdominal pain 03/24/2011  . ERYTHRASMA 05/03/2010  . BREAST PAIN, BILATERAL 05/03/2010  . HIDRADENITIS SUPPURATIVA 02/08/2009    History reviewed. No pertinent surgical history.   OB History   No obstetric history on file.     Family History  Problem Relation Age of Onset  . Healthy Mother   . Hypertension Father   . Migraines Sister     Social History   Tobacco Use  . Smoking status: Current Every Day Smoker    Packs/day: 0.50    Types: Cigarettes  . Smokeless tobacco: Never Used  . Tobacco comment: smokes inside w/ children  Vaping Use  . Vaping Use: Never used  Substance Use Topics  . Alcohol use: No  . Drug use: No    Home Medications Prior to  Admission medications   Medication Sig Start Date End Date Taking? Authorizing Provider  acetaminophen (TYLENOL) 500 MG tablet Take 500 mg by mouth every 6 (six) hours as needed for headache.     [provider]  Aspirin-Salicylamide-Caffeine (BC HEADACHE POWDER PO) Take 1 packet by mouth once.    [provider]  cyclobenzaprine (FLEXERIL) 5 MG tablet Take 1-2 tablets (5-10 mg total) by mouth 2 (two) times daily as needed for muscle spasms. 12/13/19   Wieters, Hallie C, PA-C  fluticasone (FLONASE) 50 MCG/ACT nasal spray Place 2 sprays into both nostrils daily. 05/25/18   Fawze, Mina A, PA-C  ibuprofen (ADVIL) 800 MG tablet Take 1 tablet (800 mg total) by mouth 3 (three) times daily. 12/13/19   Wieters, Hallie C, PA-C  ondansetron (ZOFRAN ODT) 4 MG disintegrating tablet Take 1 tablet (4 mg total) by mouth every 8 (eight) hours as needed for nausea or vomiting. 05/25/18   Fawze, Mina A, PA-C    Allergies    Cephalosporins and Penicillins  Review of Systems   Review of Systems  HENT: Positive for congestion.        Loss of taste and smell  Respiratory: Positive for cough.   Gastrointestinal: Positive for diarrhea.  All other systems reviewed and are negative.   Physical Exam Updated Vital Signs BP (!) 129/94 (BP Location: Left  Arm)   Pulse 92   Temp 98.6 F (37 C) (Oral)   Resp 15   SpO2 100%   Physical Exam Vitals and nursing note reviewed.  Constitutional:      General: She is not in acute distress.    Appearance: She is well-developed.     Comments: Resting comfortably in the bed in no acute distress  HENT:     Head: Normocephalic and atraumatic.  Eyes:     Conjunctiva/sclera: Conjunctivae normal.     Pupils: Pupils are equal, round, and reactive to light.  Cardiovascular:     Rate and Rhythm: Normal rate and regular rhythm.     Pulses: Normal pulses.  Pulmonary:     Effort: Pulmonary effort is normal. No respiratory distress.     Breath sounds: Normal  breath sounds. No wheezing.     Comments: Clear lung sounds.  Speaking full sentences.  No cough noted during exam.  Sats stable on room air. Abdominal:     General: There is no distension.     Palpations: Abdomen is soft. There is no mass.     Tenderness: There is no abdominal tenderness. There is no guarding or rebound.  Musculoskeletal:        General: Normal range of motion.     Cervical back: Normal range of motion and neck supple.  Skin:    General: Skin is warm and dry.     Capillary Refill: Capillary refill takes less than 2 seconds.  Neurological:     Mental Status: She is alert and oriented to person, place, and time.     ED Results / Procedures / Treatments   Labs (all labs ordered are listed, but only abnormal results are displayed) Labs Reviewed  RESPIRATORY PANEL BY RT PCR (FLU A&B, COVID)    EKG None  Radiology No results found.  Procedures Procedures (including critical care time)  Medications Ordered in ED Medications - No data to display  ED Course  I have reviewed the triage vital signs and the nursing notes.  Pertinent labs & imaging results that were available during my care of the patient were reviewed by me and considered in my medical decision making (see chart for details).    MDM Rules/Calculators/A&P                          Patient presenting for evaluation of 2-day history of URI symptoms.  On exam, patient appears nontoxic.  Pulmonary exam is reassuring.  No cough noted during exam.  As patient has loss of taste and smell, likely Covid.  Will test, patient is aware of results are pending and she will be notified with a positive result.  However pulmonary exam is reassuring, I do not believe she needs any admitted to the hospital at this time.  Discussed continued symptomatic treatment.  Patient given a work note for 10 days.  At this time, patient appears safe for discharge.  Return precautions given.  Patient states she understands and  agrees to plan.  Donna Mcclain was evaluated in Emergency Department on 01/13/2020 for the symptoms described in the history of present illness. She was evaluated in the context of the global COVID-19 pandemic, which necessitated consideration that the patient might be at risk for infection with the SARS-CoV-2 virus that causes COVID-19. Institutional protocols and algorithms that pertain to the evaluation of patients at risk for COVID-19 are in a state of rapid change  based on information released by regulatory bodies including the CDC and federal and state organizations. These policies and algorithms were followed during the patient's care in the ED.  Final Clinical Impression(s) / ED Diagnoses Final diagnoses:  Suspected COVID-19 virus infection    Rx / DC Orders ED Discharge Orders    None       Alveria Apley, PA-C 01/13/20 0944    Virgina Norfolk, DO 01/13/20 1112

## 2020-01-13 NOTE — ED Triage Notes (Signed)
Pt reports cold symptoms that began Wednesday. Reports difficulty smelling,nasal congestion, diarrhea. States she was around someone 1 week ago that was sick-unsure if it was covid.

## 2020-01-14 ENCOUNTER — Other Ambulatory Visit (HOSPITAL_COMMUNITY): Payer: Self-pay | Admitting: Adult Health

## 2020-01-14 DIAGNOSIS — U071 COVID-19: Secondary | ICD-10-CM

## 2020-01-14 NOTE — Progress Notes (Signed)
I connected by phone with Donna Mcclain on 01/14/2020 at 11:07 AM to discuss the potential use of a new treatment for mild to moderate COVID-19 viral infection in non-hospitalized patients.  This patient is a 24 y.o. female that meets the FDA criteria for Emergency Use Authorization of COVID monoclonal antibody casirivimab/imdevimab or bamlanivimab/eteseviamb.  Has a (+) direct SARS-CoV-2 viral test result  Has mild or moderate COVID-19   Is NOT hospitalized due to COVID-19  Is within 10 days of symptom onset  Has at least one of the high risk factor(s) for progression to severe COVID-19 and/or hospitalization as defined in EUA.  Specific high risk criteria : BMI > 25   Sx onset 01/11/2020   I have spoken and communicated the following to the patient or parent/caregiver regarding COVID monoclonal antibody treatment:  1. FDA has authorized the emergency use for the treatment of mild to moderate COVID-19 in adults and pediatric patients with positive results of direct SARS-CoV-2 viral testing who are 15 years of age and older weighing at least 40 kg, and who are at high risk for progressing to severe COVID-19 and/or hospitalization.  2. The significant known and potential risks and benefits of COVID monoclonal antibody, and the extent to which such potential risks and benefits are unknown.  3. Information on available alternative treatments and the risks and benefits of those alternatives, including clinical trials.  4. Patients treated with COVID monoclonal antibody should continue to self-isolate and use infection control measures (e.g., wear mask, isolate, social distance, avoid sharing personal items, clean and disinfect "high touch" surfaces, and frequent handwashing) according to CDC guidelines.   5. The patient or parent/caregiver has the option to accept or refuse COVID monoclonal antibody treatment.  After reviewing this information with the patient, the patient has agreed to  receive one of the available covid 19 monoclonal antibodies and will be provided an appropriate fact sheet prior to infusion. Noreene Filbert, NP 01/14/2020 11:07 AM

## 2020-01-15 ENCOUNTER — Ambulatory Visit (HOSPITAL_COMMUNITY): Payer: Medicaid Other

## 2020-02-08 ENCOUNTER — Emergency Department (HOSPITAL_COMMUNITY)
Admission: EM | Admit: 2020-02-08 | Discharge: 2020-02-08 | Disposition: A | Discharge status: Home / Self Care | Payer: No Typology Code available for payment source | Attending: Emergency Medicine | Admitting: Emergency Medicine

## 2020-02-08 ENCOUNTER — Encounter (HOSPITAL_COMMUNITY): Payer: Self-pay

## 2020-02-08 ENCOUNTER — Other Ambulatory Visit: Payer: Self-pay

## 2020-02-08 ENCOUNTER — Emergency Department (HOSPITAL_COMMUNITY): Payer: No Typology Code available for payment source

## 2020-02-08 DIAGNOSIS — M25562 Pain in left knee: Secondary | ICD-10-CM | POA: Diagnosis present

## 2020-02-08 DIAGNOSIS — I10 Essential (primary) hypertension: Secondary | ICD-10-CM | POA: Insufficient documentation

## 2020-02-08 DIAGNOSIS — F17210 Nicotine dependence, cigarettes, uncomplicated: Secondary | ICD-10-CM | POA: Diagnosis not present

## 2020-02-08 DIAGNOSIS — Z7982 Long term (current) use of aspirin: Secondary | ICD-10-CM | POA: Diagnosis not present

## 2020-02-08 MED ORDER — OXYCODONE-ACETAMINOPHEN 5-325 MG PO TABS
1.0000 | ORAL_TABLET | Freq: Once | ORAL | Status: AC
  Administered 2020-02-08: 1 via ORAL
  Filled 2020-02-08: qty 1

## 2020-02-08 MED ORDER — NAPROXEN 375 MG PO TABS: 375.0000 mg | ORAL_TABLET | Freq: Two times a day (BID) | ORAL | 0 refills | Status: DC

## 2020-02-08 NOTE — Progress Notes (Signed)
Orthopedic Tech Progress Note Patient Details:  MILAGROS MIDDENDORF 19-Jan-1996 [id number]  Ortho Devices Type of Ortho Device: Crutches, Knee Sleeve Ortho Device/Splint Location: LLE Ortho Device/Splint Interventions: Application, Ordered   Post Interventions Patient Tolerated: Well Instructions Provided: Adjustment of device   Lawonda Pretlow A Leana Springston 02/08/2020, 7:18 PM
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
## 2020-02-08 NOTE — ED Notes (Signed)
Patient verbalizes understanding of discharge instructions. Opportunity for questioning and answers were provided. Pt discharged from ED ambulatory with crutches.

## 2020-02-08 NOTE — Discharge Instructions (Signed)
Please refer to attached instructions

## 2020-02-22 ENCOUNTER — Emergency Department (HOSPITAL_COMMUNITY): Payer: Self-pay

## 2020-02-22 ENCOUNTER — Other Ambulatory Visit: Payer: Self-pay

## 2020-02-22 ENCOUNTER — Encounter (HOSPITAL_COMMUNITY): Payer: Self-pay | Admitting: Emergency Medicine

## 2020-02-22 ENCOUNTER — Emergency Department (HOSPITAL_COMMUNITY)
Admission: EM | Admit: 2020-02-22 | Discharge: 2020-02-22 | Disposition: A | Discharge status: Home / Self Care | Payer: Self-pay | Attending: Emergency Medicine | Admitting: Emergency Medicine

## 2020-02-22 DIAGNOSIS — L84 Corns and callosities: Secondary | ICD-10-CM | POA: Insufficient documentation

## 2020-02-22 DIAGNOSIS — F17210 Nicotine dependence, cigarettes, uncomplicated: Secondary | ICD-10-CM | POA: Insufficient documentation

## 2020-02-22 DIAGNOSIS — I10 Essential (primary) hypertension: Secondary | ICD-10-CM | POA: Insufficient documentation

## 2020-02-22 NOTE — Discharge Instructions (Signed)
Please call the [facility] to get established with a primary care provider.  Additionally, I would like for you to call the [facility] in [place].  The phone number is [phone].  Please call them to schedule appointment for ongoing evaluation and management should her symptoms fail to improve with conservative therapy.  Please continue with Epson salt soaks twice daily and use extra padding in your shoes to help soften symptoms of discomfort with ambulation.  Continue with your crutches, as needed.  Your symptoms should improve.  However, follow-up with podiatry if they do not.  Please monitor for signs of infection, including fevers or chills, redness, warmth, or worsening swelling/pain.  Return to the ED or seek immediate medical attention should you experience any new or worsening symptoms.

## 2020-02-22 NOTE — ED Provider Notes (Signed)
[facility] EMERGENCY DEPARTMENT Provider Note   CSN: [id number] Arrival date & time: 02/22/20  0845     History Chief Complaint  Patient presents with  . Toe Pain    Donna Mcclain is a 24 y.o. female with no significant past medical history presents the ED with complaints of left big toe pain.  On my examination, patient reports that she gets calluses in the flexor region of her great toes bilaterally.  Approximately 10 days ago she picked at the callus and flexor region at MCP joint of her left big toe and has had worsening tenderness since.  She states that she cannot ambulate due to the pain from her callus.  She states that it appears swollen.  She denies any significant past medical history.  No personal history of diabetes.  She works full-time, but they do not [employer] and she does not have a primary care provider.  Patient reports that she has been wearing hard soled shoes and performing Epsom salt soaks once daily, with some improvement.  She denies any numbness or weakness, but states that she has had to use crutches due to her pain.  She denies any fevers, chills, redness, warmth, or other symptoms.   HPI     Past Medical History:  Diagnosis Date  . Constipation   . Hypertension   . Seasonal allergies     Patient Active Problem List   Diagnosis Date Noted  . Migraine without aura and without status migrainosus, not intractable 07/09/2012  . Tension headache 07/09/2012  . Acute abdominal pain 03/24/2011  . ERYTHRASMA 05/03/2010  . BREAST PAIN, BILATERAL 05/03/2010  . HIDRADENITIS SUPPURATIVA 02/08/2009    History reviewed. No pertinent surgical history.   OB History   No obstetric history on file.     Family History  Problem Relation Age of Onset  . Healthy Mother   . Hypertension Father   . Migraines Sister     Social History   Tobacco Use  . Smoking status: Current Every Day Smoker    Packs/day: 0.50     Types: Cigarettes  . Smokeless tobacco: Never Used  . Tobacco comment: smokes inside w/ children  Vaping Use  . Vaping Use: Never used  Substance Use Topics  . Alcohol use: No  . Drug use: No    Home Medications Prior to Admission medications   Medication Sig Start Date End Date Taking? Authorizing Provider  acetaminophen (TYLENOL) 500 MG tablet Take 500 mg by mouth every 6 (six) hours as needed for headache.    Yes [provider]  naproxen (NAPROSYN) 375 MG tablet Take 1 tablet (375 mg total) by mouth 2 (two) times daily. 02/08/20  Yes Felicie Morn, NP    Allergies    Cephalosporins and Penicillins  Review of Systems   Review of Systems  Constitutional: Negative for fever.  Musculoskeletal: Positive for gait problem.  Skin: Positive for color change.  Neurological: Negative for weakness and numbness.  Hematological: Does not bruise/bleed easily.    Physical Exam Updated Vital Signs BP 118/82 (BP Location: Left Arm)   Pulse 86   Temp 97.8 F (36.6 C) (Oral)   Resp 18   Ht 5\' 2"  (1.575 m)   Wt 70.3 kg   LMP 02/14/2020   SpO2 100%   BMI 28.35 kg/m   Physical Exam Vitals and nursing note reviewed. Exam conducted with a chaperone present.  Constitutional:      Appearance: Normal  appearance.  HENT:     Head: Normocephalic and atraumatic.  Eyes:     General: No scleral icterus.    Conjunctiva/sclera: Conjunctivae normal.  Cardiovascular:     Rate and Rhythm: Normal rate.     Pulses: Normal pulses.  Pulmonary:     Effort: Pulmonary effort is normal.  Musculoskeletal:     Comments: Left foot: Pedal pulse intact, sensation intact throughout.  Able to wiggle toes.  TTP over plantar aspect at flexor crease of first MCP.  Hyperplasia noted.  No fluctuance or masses.  No erythema or warmth.  Able to flex and extend great toe with strength intact against resistance.  Sensation intact locally.  Cannot appropriately assess capillary refill in the great toe due to  painted toenails, however perfusion appears intact.  Skin:    General: Skin is dry.  Neurological:     General: No focal deficit present.     Mental Status: She is alert and oriented to person, place, and time.     GCS: GCS eye subscore is 4. GCS verbal subscore is 5. GCS motor subscore is 6.  Psychiatric:        Mood and Affect: Mood normal.        Behavior: Behavior normal.        Thought Content: Thought content normal.       ED Results / Procedures / Treatments   Labs (all labs ordered are listed, but only abnormal results are displayed) Labs Reviewed - No data to display  EKG None  Radiology DG Foot Complete Left  Result Date: 02/22/2020 CLINICAL DATA:  Left foot pain without known injury. EXAM: LEFT FOOT - COMPLETE 3+ VIEW COMPARISON:  None. FINDINGS: There is no evidence of fracture or dislocation. There is no evidence of arthropathy or other focal bone abnormality. Soft tissues are unremarkable. IMPRESSION: Negative. Electronically Signed   By: Lupita Raider M.D.   On: 02/22/2020 10:21    Procedures Procedures (including critical care time)  Medications Ordered in ED Medications - No data to display  ED Course  I have reviewed the triage vital signs and the nursing notes.  Pertinent labs & imaging results that were available during my care of the patient were reviewed by me and considered in my medical decision making (see chart for details).    MDM Rules/Calculators/A&P                          Patient's history and physical exam is suggestive of a callus.  No erythema or warmth concerning for infection.  She is able to flex and extend the affected toe.  She is neurovascularly intact.    Plain films are personally reviewed and without any evidence of arthropathy or other acute osseous abnormalities.  I encouraged her to continue with Epsom salt soaks periodically given that it has provided relief.  Also encouraging her to use extra padding in her shoes to  help soften her symptoms of discomfort with ambulation.  We will place a referral to podiatry.  We will also refer her to [facility] given that she does not have medical insurance.  ED return precautions discussed.  Patient voices understanding and is agreeable to the plan.  Final Clinical Impression(s) / ED Diagnoses Final diagnoses:  Callus of foot    Rx / DC Orders ED Discharge Orders    None       Lorelee New,  PA-C 02/22/20 1052    Virgina Norfolk, DO 02/22/20 1306
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
## 2020-02-29 ENCOUNTER — Other Ambulatory Visit: Payer: Self-pay

## 2020-02-29 ENCOUNTER — Ambulatory Visit (HOSPITAL_COMMUNITY)
Admission: EM | Admit: 2020-02-29 | Discharge: 2020-02-29 | Disposition: A | Discharge status: Home / Self Care | Payer: Self-pay | Attending: Physician Assistant | Admitting: Physician Assistant

## 2020-02-29 ENCOUNTER — Encounter (HOSPITAL_COMMUNITY): Payer: Self-pay

## 2020-02-29 DIAGNOSIS — L089 Local infection of the skin and subcutaneous tissue, unspecified: Secondary | ICD-10-CM

## 2020-02-29 MED ORDER — SULFAMETHOXAZOLE-TRIMETHOPRIM 400-80 MG PO TABS: 1.0000 | ORAL_TABLET | Freq: Two times a day (BID) | ORAL | 0 refills | Status: DC

## 2020-02-29 NOTE — ED Triage Notes (Signed)
Pt present left foot swelling and painful to the touch. Pt states she pulled a callous off her foot a that's when the swelling begin. Symptoms started 3 weeks.

## 2020-02-29 NOTE — Discharge Instructions (Signed)
Return if any problems.

## 2020-02-29 NOTE — ED Provider Notes (Signed)
[facility]    CSN: [id number] Arrival date & time: 02/29/20  1828      History   Chief Complaint Chief Complaint  Patient presents with  . Foot Swelling    left foot     HPI Donna Mcclain is a 24 y.o. female.   Pt cut a callous off of her toe.  Pt reports area is painful and swollen now.  Pt reports pain in her foot and redness  The history is provided by the patient. No language interpreter was used.  Foot Pain This is a new problem. The current episode started more than 1 week ago. The problem occurs constantly. The problem has been gradually worsening. Nothing relieves the symptoms. She has tried nothing for the symptoms. The treatment provided no relief.    Past Medical History:  Diagnosis Date  . Constipation   . Hypertension   . Seasonal allergies     Patient Active Problem List   Diagnosis Date Noted  . Migraine without aura and without status migrainosus, not intractable 07/09/2012  . Tension headache 07/09/2012  . Acute abdominal pain 03/24/2011  . ERYTHRASMA 05/03/2010  . BREAST PAIN, BILATERAL 05/03/2010  . HIDRADENITIS SUPPURATIVA 02/08/2009    History reviewed. No pertinent surgical history.  OB History   No obstetric history on file.      Home Medications    Prior to Admission medications   Medication Sig Start Date End Date Taking? Authorizing Provider  acetaminophen (TYLENOL) 500 MG tablet Take 500 mg by mouth every 6 (six) hours as needed for headache.     [provider]  naproxen (NAPROSYN) 375 MG tablet Take 1 tablet (375 mg total) by mouth 2 (two) times daily. 02/08/20   Felicie Morn, NP    Family History Family History  Problem Relation Age of Onset  . Healthy Mother   . Hypertension Father   . Migraines Sister     Social History Social History   Tobacco Use  . Smoking status: Current Every Day Smoker    Packs/day: 0.50    Types: Cigarettes  . Smokeless tobacco: Never Used  . Tobacco comment:  smokes inside w/ children  Vaping Use  . Vaping Use: Never used  Substance Use Topics  . Alcohol use: No  . Drug use: No     Allergies   Cephalosporins and Penicillins   Review of Systems Review of Systems  All other systems reviewed and are negative.    Physical Exam Triage Vital Signs ED Triage Vitals  Enc Vitals Group     BP 02/29/20 1900 129/82     Pulse Rate 02/29/20 1900 (!) 108     Resp 02/29/20 1900 16     Temp 02/29/20 1900 98.5 F (36.9 C)     Temp Source 02/29/20 1900 Oral     SpO2 02/29/20 1900 100 %     Weight --      Height --      Head Circumference --      Peak Flow --      Pain Score 02/29/20 1902 10     Pain Loc --      Pain Edu? --      Excl. in GC? --    No data found.  Updated Vital Signs BP 129/82 (BP Location: Right Arm)   Pulse (!) 108   Temp 98.5 F (36.9 C) (Oral)   Resp 16   LMP 02/14/2020   SpO2 100%  Visual Acuity Right Eye Distance:   Left Eye Distance:   Bilateral Distance:    Right Eye Near:   Left Eye Near:    Bilateral Near:     Physical Exam Vitals and nursing note reviewed.  Constitutional:      Appearance: She is well-developed.  HENT:     Head: Normocephalic.  Cardiovascular:     Rate and Rhythm: Normal rate.  Pulmonary:     Effort: Pulmonary effort is normal.  Abdominal:     General: There is no distension.  Musculoskeletal:        General: Swelling and tenderness present. Normal range of motion.     Cervical back: Normal range of motion.     Comments: Redness lower foot, swollen left first toe,  nv and ns intact  Neurological:     Mental Status: She is alert and oriented to person, place, and time.  Psychiatric:        Mood and Affect: Mood normal.      UC Treatments / Results  Labs (all labs ordered are listed, but only abnormal results are displayed) Labs Reviewed - No data to display  EKG   Radiology No results found.  Procedures Procedures (including critical care  time)  Medications Ordered in UC Medications - No data to display  Initial Impression / Assessment and Plan / UC Course  I have reviewed the triage vital signs and the nursing notes.  Pertinent labs & imaging results that were available during my care of the patient were reviewed by me and considered in my medical decision making (see chart for details).     MDM:  Rx for  Final Clinical Impressions(s) / UC Diagnoses   Final diagnoses:  Infection of left foot   Discharge Instructions   None    ED Prescriptions    Medication Sig Dispense Auth. Provider   sulfamethoxazole-trimethoprim (BACTRIM) 400-80 MG tablet Take 1 tablet by mouth 2 (two) times daily. 20 tablet Elson Areas, [place]     PDMP not reviewed this encounter.  An After Visit Summary was printed and given to the patient.    Elson Areas, [place] 02/29/20 1935

## 2020-03-03 ENCOUNTER — Ambulatory Visit (INDEPENDENT_AMBULATORY_CARE_PROVIDER_SITE_OTHER): Payer: Self-pay

## 2020-03-03 ENCOUNTER — Encounter (HOSPITAL_COMMUNITY): Payer: Self-pay

## 2020-03-03 ENCOUNTER — Other Ambulatory Visit: Payer: Self-pay

## 2020-03-03 ENCOUNTER — Ambulatory Visit (HOSPITAL_COMMUNITY)
Admission: EM | Admit: 2020-03-03 | Discharge: 2020-03-03 | Disposition: A | Discharge status: Home / Self Care | Payer: Self-pay | Attending: Internal Medicine | Admitting: Internal Medicine

## 2020-03-03 DIAGNOSIS — L089 Local infection of the skin and subcutaneous tissue, unspecified: Secondary | ICD-10-CM

## 2020-03-03 DIAGNOSIS — M79672 Pain in left foot: Secondary | ICD-10-CM

## 2020-03-03 LAB — CBC WITH DIFFERENTIAL/PLATELET
Abs Immature Granulocytes: 0.03 10*3/uL (ref 0.00–0.07)
Basophils Absolute: 0.1 10*3/uL (ref 0.0–0.1)
Basophils Relative: 1 %
Eosinophils Absolute: 0.3 10*3/uL (ref 0.0–0.5)
Eosinophils Relative: 2 %
HCT: 37.9 % (ref 36.0–46.0)
Hemoglobin: 12.3 g/dL (ref 12.0–15.0)
Immature Granulocytes: 0 %
Lymphocytes Relative: 24 %
Lymphs Abs: 2.9 10*3/uL (ref 0.7–4.0)
MCH: 29.8 pg (ref 26.0–34.0)
MCHC: 32.5 g/dL (ref 30.0–36.0)
MCV: 91.8 fL (ref 80.0–100.0)
Monocytes Absolute: 1 10*3/uL (ref 0.1–1.0)
Monocytes Relative: 8 %
Neutro Abs: 7.9 10*3/uL — ABNORMAL HIGH (ref 1.7–7.7)
Neutrophils Relative %: 65 %
Platelets: 345 10*3/uL (ref 150–400)
RBC: 4.13 MIL/uL (ref 3.87–5.11)
RDW: 11.9 % (ref 11.5–15.5)
WBC: 12.2 10*3/uL — ABNORMAL HIGH (ref 4.0–10.5)
nRBC: 0 % (ref 0.0–0.2)

## 2020-03-03 MED ORDER — SULFAMETHOXAZOLE-TRIMETHOPRIM 400-80 MG PO TABS: 2.0000 | ORAL_TABLET | Freq: Two times a day (BID) | ORAL | 0 refills | Status: DC

## 2020-03-03 MED ORDER — TRAMADOL HCL 50 MG PO TABS: 50.0000 mg | ORAL_TABLET | Freq: Four times a day (QID) | ORAL | 0 refills | Status: DC | PRN

## 2020-03-03 MED ORDER — AMOXICILLIN-POT CLAVULANATE 875-125 MG PO TABS: 1.0000 | ORAL_TABLET | Freq: Two times a day (BID) | ORAL | 0 refills | Status: DC

## 2020-03-03 NOTE — Discharge Instructions (Addendum)
Nothing concerning on your x-ray.  We are adding in another antibiotic.  Make sure you finish the full course of the Bactrim along with the Augmentin. I have sent in some more bactrim pills and would like  for you to take 2 tablets twice a day starting tomorrow.  Tramadol for pain as needed.  You can continue Tylenol or ibuprofen also as needed for pain, fevers. For any worsening symptoms you  will need to go to the ER

## 2020-03-03 NOTE — ED Provider Notes (Signed)
[facility]    CSN: [id number] Arrival date & time: 03/03/20  1239      History   Chief Complaint Chief Complaint  Patient presents with  . Foot Swelling    HPI Donna Mcclain is a 24 y.o. female.   Pt is a 24 year old female that presents today with worsening left foot infection. Was seen here on 02/29/20.  Was started on Bactrim for infection.  Has been taking the medication as prescribed.  She has had increased swelling, pain and worsening of infection to the left foot.  Unsure of fever but has been taking Tylenol and ibuprofen around-the-clock.  She is tachycardic today.     Past Medical History:  Diagnosis Date  . Constipation   . Hypertension   . Seasonal allergies     Patient Active Problem List   Diagnosis Date Noted  . Migraine without aura and without status migrainosus, not intractable 07/09/2012  . Tension headache 07/09/2012  . Acute abdominal pain 03/24/2011  . ERYTHRASMA 05/03/2010  . BREAST PAIN, BILATERAL 05/03/2010  . HIDRADENITIS SUPPURATIVA 02/08/2009    History reviewed. No pertinent surgical history.  OB History   No obstetric history on file.      Home Medications    Prior to Admission medications   Medication Sig Start Date End Date Taking? Authorizing Provider  acetaminophen (TYLENOL) 500 MG tablet Take 500 mg by mouth every 6 (six) hours as needed for headache.     [provider]  amoxicillin-clavulanate (AUGMENTIN) 875-125 MG tablet Take 1 tablet by mouth every 12 (twelve) hours. 03/03/20   Dahlia Byes A, NP  naproxen (NAPROSYN) 375 MG tablet Take 1 tablet (375 mg total) by mouth 2 (two) times daily. 02/08/20   Felicie Morn, NP  sulfamethoxazole-trimethoprim (BACTRIM) 400-80 MG tablet Take 2 tablets by mouth 2 (two) times daily. 03/03/20   Dahlia Byes A, NP  traMADol (ULTRAM) 50 MG tablet Take 1 tablet (50 mg total) by mouth every 6 (six) hours as needed. 03/03/20   Janace Aris, NP    Family  History Family History  Problem Relation Age of Onset  . Healthy Mother   . Hypertension Father   . Migraines Sister     Social History Social History   Tobacco Use  . Smoking status: Current Every Day Smoker    Packs/day: 0.50    Types: Cigarettes  . Smokeless tobacco: Never Used  . Tobacco comment: smokes inside w/ children  Vaping Use  . Vaping Use: Never used  Substance Use Topics  . Alcohol use: No  . Drug use: No     Allergies   Cephalosporins and Penicillins   Review of Systems Review of Systems   Physical Exam Triage Vital Signs ED Triage Vitals  Enc Vitals Group     BP 03/03/20 1356 (!) 132/94     Pulse Rate 03/03/20 1356 (!) 111     Resp 03/03/20 1356 16     Temp 03/03/20 1356 97.9 F (36.6 C)     Temp Source 03/03/20 1356 Oral     SpO2 03/03/20 1356 100 %     Weight --      Height --      Head Circumference --      Peak Flow --      Pain Score 03/03/20 1459 6     Pain Loc --      Pain Edu? --      Excl. in GC? --  No data found.  Updated Vital Signs BP (!) 132/94 (BP Location: Right Arm)   Pulse (!) 111   Temp 97.9 F (36.6 C) (Oral)   Resp 16   LMP 02/14/2020   SpO2 100%   Visual Acuity Right Eye Distance:   Left Eye Distance:   Bilateral Distance:    Right Eye Near:   Left Eye Near:    Bilateral Near:     Physical Exam Vitals and nursing note reviewed.  Constitutional:      General: She is not in acute distress.    Appearance: Normal appearance. She is not ill-appearing, toxic-appearing or diaphoretic.  HENT:     Head: Normocephalic.     Nose: Nose normal.  Eyes:     Conjunctiva/sclera: Conjunctivae normal.  Pulmonary:     Effort: Pulmonary effort is normal.  Musculoskeletal:        General: Normal range of motion.     Cervical back: Normal range of motion.     Comments: Moderate swelling and erythema, generalized to the entire foot. Very tender.   Skin:    General: Skin is warm and dry.     Findings: No rash.   Neurological:     Mental Status: She is alert.  Psychiatric:        Mood and Affect: Mood normal.      UC Treatments / Results  Labs (all labs ordered are listed, but only abnormal results are displayed) Labs Reviewed  CBC WITH DIFFERENTIAL/PLATELET - Abnormal; Notable for the following components:      Result Value   WBC 12.2 (*)    Neutro Abs 7.9 (*)    All other components within normal limits    EKG   Radiology DG Foot Complete Left  Result Date: 03/03/2020 CLINICAL DATA:  Pain EXAM: LEFT FOOT - COMPLETE 3+ VIEW COMPARISON:  None. FINDINGS: Frontal, oblique, and lateral views were obtained. There is soft tissue swelling dorsally. No fracture or dislocation. Joint spaces appear normal. No erosive change or bony destruction. A small os naviculare is noted, an anatomic variant. IMPRESSION: Soft tissue swelling dorsally. No bony destruction or erosion. No joint space narrowing. No fracture or dislocation. Electronically Signed   By: Bretta Bang III M.D.   On: 03/03/2020 14:39    Procedures Procedures (including critical care time)  Medications Ordered in UC Medications - No data to display  Initial Impression / Assessment and Plan / UC Course  I have reviewed the triage vital signs and the nursing notes.  Pertinent labs & imaging results that were available during my care of the patient were reviewed by me and considered in my medical decision making (see chart for details).     Left foot infection worsening on Bactrim- 400-80 No osteo on x ray.  Will change to double strength or for strength Bactrim twice a day until course is finished.  Adding on Augmentin for better coverage. White blood cell count 12.2 Tramadol for pain as needed Strict return and ER precautions given.  Final Clinical Impressions(s) / UC Diagnoses   Final diagnoses:  Left foot infection     Discharge Instructions     Nothing concerning on your x-ray.  We are adding in another  antibiotic.  Make sure you finish the full course of the Bactrim along with the Augmentin. I have sent in some more bactrim pills and would like  for you to take 2 tablets twice a day starting tomorrow.  Tramadol for pain as  needed.  You can continue Tylenol or ibuprofen also as needed for pain, fevers. For any worsening symptoms you  will need to go to the ER    ED Prescriptions    Medication Sig Dispense Auth. Provider   amoxicillin-clavulanate (AUGMENTIN) 875-125 MG tablet Take 1 tablet by mouth every 12 (twelve) hours. 14 tablet Aaran Enberg A, NP   traMADol (ULTRAM) 50 MG tablet Take 1 tablet (50 mg total) by mouth every 6 (six) hours as needed. 12 tablet Whitney Hillegass A, NP   sulfamethoxazole-trimethoprim (BACTRIM) 400-80 MG tablet Take 2 tablets by mouth 2 (two) times daily. 20 tablet Lori Popowski A, NP     I have reviewed the PDMP during this encounter.   Janace Aris, NP 03/03/20 [phone]

## 2020-03-03 NOTE — ED Triage Notes (Signed)
Pt is following up concerning her  left foot swelling and pain. Symptom started on 02/22/2020. Pt foot is red and very swollen. Pt is walking on the side of her foot because of the pain.

## 2020-03-03 NOTE — ED Triage Notes (Signed)
Pt states has been taking the abx x last 4 days with progressive worsening of pain, redness, swelling to left foot.  Denies fevers.

## 2020-03-05 ENCOUNTER — Emergency Department (HOSPITAL_COMMUNITY)
Admission: EM | Admit: 2020-03-05 | Discharge: 2020-03-05 | Disposition: A | Discharge status: Home / Self Care | Payer: Self-pay | Attending: Emergency Medicine | Admitting: Emergency Medicine

## 2020-03-05 ENCOUNTER — Encounter (HOSPITAL_COMMUNITY): Payer: Self-pay | Admitting: Emergency Medicine

## 2020-03-05 ENCOUNTER — Emergency Department (HOSPITAL_COMMUNITY): Payer: Self-pay

## 2020-03-05 DIAGNOSIS — L02612 Cutaneous abscess of left foot: Secondary | ICD-10-CM | POA: Insufficient documentation

## 2020-03-05 DIAGNOSIS — L03116 Cellulitis of left lower limb: Secondary | ICD-10-CM | POA: Insufficient documentation

## 2020-03-05 DIAGNOSIS — F17210 Nicotine dependence, cigarettes, uncomplicated: Secondary | ICD-10-CM | POA: Insufficient documentation

## 2020-03-05 DIAGNOSIS — I10 Essential (primary) hypertension: Secondary | ICD-10-CM | POA: Insufficient documentation

## 2020-03-05 DIAGNOSIS — L02619 Cutaneous abscess of unspecified foot: Secondary | ICD-10-CM

## 2020-03-05 LAB — CBC WITH DIFFERENTIAL/PLATELET
Abs Immature Granulocytes: 0.03 10*3/uL (ref 0.00–0.07)
Basophils Absolute: 0.1 10*3/uL (ref 0.0–0.1)
Basophils Relative: 1 %
Eosinophils Absolute: 0.5 10*3/uL (ref 0.0–0.5)
Eosinophils Relative: 4 %
HCT: 39.9 % (ref 36.0–46.0)
Hemoglobin: 12.9 g/dL (ref 12.0–15.0)
Immature Granulocytes: 0 %
Lymphocytes Relative: 32 %
Lymphs Abs: 4.2 10*3/uL — ABNORMAL HIGH (ref 0.7–4.0)
MCH: 29.6 pg (ref 26.0–34.0)
MCHC: 32.3 g/dL (ref 30.0–36.0)
MCV: 91.5 fL (ref 80.0–100.0)
Monocytes Absolute: 0.7 10*3/uL (ref 0.1–1.0)
Monocytes Relative: 6 %
Neutro Abs: 7.5 10*3/uL (ref 1.7–7.7)
Neutrophils Relative %: 57 %
Platelets: 335 10*3/uL (ref 150–400)
RBC: 4.36 MIL/uL (ref 3.87–5.11)
RDW: 11.4 % — ABNORMAL LOW (ref 11.5–15.5)
WBC: 13 10*3/uL — ABNORMAL HIGH (ref 4.0–10.5)
nRBC: 0 % (ref 0.0–0.2)

## 2020-03-05 LAB — BASIC METABOLIC PANEL
Anion gap: 13 (ref 5–15)
BUN: 7 mg/dL (ref 6–20)
CO2: 23 mmol/L (ref 22–32)
Calcium: 9.5 mg/dL (ref 8.9–10.3)
Chloride: 99 mmol/L (ref 98–111)
Creatinine, Ser: 0.85 mg/dL (ref 0.44–1.00)
GFR, Estimated: >60 mL/min (ref >60)
Glucose, Bld: 84 mg/dL (ref 70–99)
Potassium: 3.3 mmol/L — ABNORMAL LOW (ref 3.5–5.1)
Sodium: 135 mmol/L (ref 135–145)

## 2020-03-05 LAB — LACTIC ACID, PLASMA
Lactic Acid, Venous: 1.1 mmol/L (ref 0.5–1.9)
Lactic Acid, Venous: 1.1 mmol/L (ref 0.5–1.9)

## 2020-03-05 LAB — I-STAT BETA HCG BLOOD, ED (MC, WL, AP ONLY): I-stat hCG, quantitative: <5 m[IU]/mL (ref <5)

## 2020-03-05 LAB — SEDIMENTATION RATE: Sed Rate: 38 mm/hr — ABNORMAL HIGH (ref 0–22)

## 2020-03-05 MED ORDER — BACITRACIN ZINC 500 UNIT/GM EX OINT
TOPICAL_OINTMENT | Freq: Two times a day (BID) | CUTANEOUS | Status: AC
  Filled 2020-03-05: qty 0.9

## 2020-03-05 MED ORDER — DEXTROSE 5 % IV SOLN
1500.0000 mg | Freq: Once | INTRAVENOUS | Status: AC
  Administered 2020-03-05: 1500 mg via INTRAVENOUS
  Filled 2020-03-05: qty 75

## 2020-03-05 NOTE — ED Provider Notes (Signed)
[facility] EMERGENCY DEPARTMENT Provider Note   CSN: [id number] Arrival date & time: 03/05/20  4010     History Chief Complaint  Patient presents with  . Foot Pain    Donna Mcclain is a 24 y.o. female.  24yo female with no significant past medical history presents with complaint for worsening left foot infection. Patient was seen here 02/22/20 with pain to the plantar surface of her left foot at the first MTP, thought to be a callous and discharged. Patient then returned on 02/29/20 with swelling and worsening pain and was started on Bactrim. Patient followed up with UC 03/03/20 and was advised to take 2 Bactrim BID and started on Augmentin. Patient reports a blister on the foot that started yesterday and now has a pin hole area of drainage. No history of diabetes. XR 2 days ago negative for FB and osteomyelitis. No fevers, no other complaints or concerns.         Past Medical History:  Diagnosis Date  . Constipation   . Hypertension   . Seasonal allergies     Patient Active Problem List   Diagnosis Date Noted  . Migraine without aura and without status migrainosus, not intractable 07/09/2012  . Tension headache 07/09/2012  . Acute abdominal pain 03/24/2011  . ERYTHRASMA 05/03/2010  . BREAST PAIN, BILATERAL 05/03/2010  . HIDRADENITIS SUPPURATIVA 02/08/2009    History reviewed. No pertinent surgical history.   OB History   No obstetric history on file.     Family History  Problem Relation Age of Onset  . Healthy Mother   . Hypertension Father   . Migraines Sister     Social History   Tobacco Use  . Smoking status: Current Every Day Smoker    Packs/day: 0.50    Types: Cigarettes  . Smokeless tobacco: Never Used  . Tobacco comment: smokes inside w/ children  Vaping Use  . Vaping Use: Never used  Substance Use Topics  . Alcohol use: No  . Drug use: No    Home Medications Prior to Admission medications   Medication Sig Start  Date End Date Taking? Authorizing Provider  acetaminophen (TYLENOL) 500 MG tablet Take 500 mg by mouth every 6 (six) hours as needed for mild pain (or headaches).    Yes [provider]  amoxicillin-clavulanate (AUGMENTIN) 875-125 MG tablet Take 1 tablet by mouth every 12 (twelve) hours. Patient taking differently: Take 1 tablet by mouth every 12 (twelve) hours. FOR 7 DAYS 03/03/20  Yes Bast, Traci A, NP  naproxen (NAPROSYN) 375 MG tablet Take 1 tablet (375 mg total) by mouth 2 (two) times daily. Patient taking differently: Take 375 mg by mouth 2 (two) times daily as needed for mild pain.  02/08/20  Yes Felicie Morn, NP  sulfamethoxazole-trimethoprim (BACTRIM) 400-80 MG tablet Take 2 tablets by mouth 2 (two) times daily. 03/03/20  Yes Bast, Traci A, NP  traMADol (ULTRAM) 50 MG tablet Take 1 tablet (50 mg total) by mouth every 6 (six) hours as needed. Patient taking differently: Take 50 mg by mouth every 6 (six) hours as needed (for pain).  03/03/20  Yes Bast, Traci A, NP    Allergies    Cephalosporins and Penicillins  Review of Systems   Review of Systems  Constitutional: Negative for fever.  Musculoskeletal: Positive for arthralgias, gait problem and joint swelling.  Skin: Positive for color change.  Allergic/Immunologic: Negative for immunocompromised state.  Neurological: Negative for weakness and numbness.  All other systems  reviewed and are negative.   Physical Exam Updated Vital Signs BP 130/88 (BP Location: Right Arm)   Pulse 64   Temp 98.6 F (37 C) (Oral)   Resp 17   LMP 02/14/2020   SpO2 100%   Physical Exam Vitals and nursing note reviewed.  Constitutional:      General: She is not in acute distress.    Appearance: She is well-developed. She is not diaphoretic.  HENT:     Head: Normocephalic and atraumatic.  Cardiovascular:     Pulses: Normal pulses.  Pulmonary:     Effort: Pulmonary effort is normal.  Musculoskeletal:        General: Swelling and  tenderness present.     Comments: Swelling to left foot and ankle, warm to the touch, large bullae to dorsum of foot at the 1st MTP with pinpoint area of purulent drainage.  Skin:    Findings: Erythema present.  Neurological:     Mental Status: She is alert and oriented to person, place, and time.     Sensory: No sensory deficit.  Psychiatric:        Behavior: Behavior normal.           ED Results / Procedures / Treatments   Labs (all labs ordered are listed, but only abnormal results are displayed) Labs Reviewed  BASIC METABOLIC PANEL - Abnormal; Notable for the following components:      Result Value   Potassium 3.3 (*)    All other components within normal limits  CBC WITH DIFFERENTIAL/PLATELET - Abnormal; Notable for the following components:   WBC 13.0 (*)    RDW 11.4 (*)    Lymphs Abs 4.2 (*)    All other components within normal limits  SEDIMENTATION RATE - Abnormal; Notable for the following components:   Sed Rate 38 (*)    All other components within normal limits  CULTURE, BLOOD (ROUTINE X 2)  CULTURE, BLOOD (ROUTINE X 2)  AEROBIC CULTURE (SUPERFICIAL SPECIMEN)  LACTIC ACID, PLASMA  LACTIC ACID, PLASMA  I-STAT BETA HCG BLOOD, ED ([facility], [facility], [facility] ONLY)    EKG None  Radiology DG Foot Complete Left  Result Date: 03/05/2020 CLINICAL DATA:  Foot infection EXAM: LEFT FOOT - COMPLETE 3+ VIEW COMPARISON:  03/03/2020 FINDINGS: Soft tissue swelling along the dorsum of the foot appears to have improved slightly since prior study. No acute bony abnormality. Specifically, no fracture, subluxation, or dislocation. No bone destruction or erosive changes. IMPRESSION: No acute bony abnormality. Electronically Signed   By: Charlett Nose M.D.   On: 03/05/2020 18:01    Procedures Procedures (including critical care time)  Medications Ordered in ED Medications  dalbavancin (DALVANCE) 1,500 mg in dextrose 5 % 500 mL IVPB (1,500 mg Intravenous New Bag/Given 03/05/20 2053)   bacitracin ointment (has no administration in time range)    ED Course  I have reviewed the triage vital signs and the nursing notes.  Pertinent labs & imaging results that were available during my care of the patient were reviewed by me and considered in my medical decision making (see chart for details).    MDM Rules/Calculators/A&P                          Wound debrided and culture obtained. CBC with mild leukocytosis at 13k. SED rate mildly elevated. XR negative for osteomyelitis. Discussed with Dr. Bernette Mayers, ER attending who has seen the patient. Plan is for dose of Dalvance in  the ER and referred to wound care for follow up. Given strict return to ER precautions, patient verbalizes understanding.  Final Clinical Impression(s) / ED Diagnoses Final diagnoses:  Abscess of foot  Cellulitis of left lower extremity    Rx / DC Orders ED Discharge Orders         Ordered    Ambulatory referral to Infectious Disease       Comments: Cellulitis patient:  Received dalbavancin on 03/05/2020.   03/05/20 1932    Ambulatory referral to Wound Clinic       Comments: Abscess to left foot treated with single dose dalvance in the ER. Needs wound care follow up.   03/05/20 2040           Jeannie Fend, PA-C 03/05/20 2105    Pollyann Savoy, MD 03/05/20 2240

## 2020-03-05 NOTE — Discharge Instructions (Signed)
Warm compresses to foot. STOP at home antibiotics. The dose given in the ER today is meant to treat for several days. Referral given to wound care for follow up, return to the ER for any worsening or concerning symptoms.

## 2020-03-05 NOTE — ED Triage Notes (Signed)
Pt reports she has been seen multiple times with a few diff round of abx for wound to L foot, states she went to urgent care on Saturday for wound again and added another abx but states that wound is not improving. No known history of diabetes.

## 2020-03-05 NOTE — Progress Notes (Signed)
Orthopedic Tech Progress Note Patient Details:  Donna Mcclain 12/14/1995 [id number]  Ortho Devices Type of Ortho Device: Postop shoe/boot Ortho Device/Splint Location: lle Ortho Device/Splint Interventions: Ordered, Application, Adjustment   Post Interventions Patient Tolerated: Well Instructions Provided: Care of device, Adjustment of device   Trinna Post 03/05/2020, 10:10 PM

## 2020-03-05 NOTE — ED Notes (Signed)
DC instructions reviewed with patient and patient verbalized understanding. Patient amhoebulatory with crutches and post op shoe.

## 2020-03-07 ENCOUNTER — Other Ambulatory Visit: Payer: Self-pay

## 2020-03-07 ENCOUNTER — Ambulatory Visit (INDEPENDENT_AMBULATORY_CARE_PROVIDER_SITE_OTHER): Payer: Self-pay | Admitting: Internal Medicine

## 2020-03-07 ENCOUNTER — Encounter: Payer: Self-pay | Admitting: Internal Medicine

## 2020-03-07 VITALS — BP 112/78 | HR 101 | Wt 153.0 lb

## 2020-03-07 DIAGNOSIS — Z114 Encounter for screening for human immunodeficiency virus [HIV]: Secondary | ICD-10-CM | POA: Insufficient documentation

## 2020-03-07 DIAGNOSIS — L089 Local infection of the skin and subcutaneous tissue, unspecified: Secondary | ICD-10-CM

## 2020-03-07 DIAGNOSIS — Z1159 Encounter for screening for other viral diseases: Secondary | ICD-10-CM

## 2020-03-07 NOTE — Progress Notes (Signed)
[facility] Infectious Disease  Reason for Consult: cellulitis Referring Provider: ED provider  HPI:  Donna Mcclain is a 24 y.o. female who presents to clinic for further evaluation of cellultis.     She was initially seen in the ED 02/22/20 with pain to the plantar surface of her left foot at the first MTP.  This was thought to be a callus and she was discharged.  She returned 02/29/20 with swelling and worsening pain. Started on TMP-SMX SS at that point. Then back ED on 03/03/20 with progression of her pain and worsening swelling.  She was changed to TMP-SMX DS and started on Augmentin the same day.  She unfortunately continued to worsen and developed a small area of drainage on the plantar surface of her MTP joint.  She has no significant PMHx and no history of diabetes.  In the ED, x-rays were negative for bony abnormality and just showed soft tissue swelling.  WBC was 13, ESR 38.  She underwent debridement of her wound (see ED pictures) and superficial wound culture grew Group B Strep.  She was given a dose of dalbavancin in the ED on 03/05/20 and presents today for follow up.  Since receiving dalbavancin, she notes improvement in swelling and pain.  She denies any fevers or chills.  She does continue to have small ulcer that is draining.  She can feel her toes but there is some tingling.   Patient's Medications  New Prescriptions   No medications on file  Previous Medications   ACETAMINOPHEN (TYLENOL) 500 MG TABLET    Take 500 mg by mouth every 6 (six) hours as needed for mild pain (or headaches).    NAPROXEN (NAPROSYN) 375 MG TABLET    Take 1 tablet (375 mg total) by mouth 2 (two) times daily.   TRAMADOL (ULTRAM) 50 MG TABLET    Take 1 tablet (50 mg total) by mouth every 6 (six) hours as needed.  Modified Medications   No medications on file  Discontinued Medications   AMOXICILLIN-CLAVULANATE (AUGMENTIN) 875-125 MG TABLET    Take 1 tablet by mouth every 12  (twelve) hours.   SULFAMETHOXAZOLE-TRIMETHOPRIM (BACTRIM) 400-80 MG TABLET    Take 2 tablets by mouth 2 (two) times daily.      Past Medical History:  Diagnosis Date  . Constipation   . Hypertension   . Seasonal allergies     Social History   Tobacco Use  . Smoking status: Current Every Day Smoker    Packs/day: 0.50    Types: Cigarettes  . Smokeless tobacco: Never Used  . Tobacco comment: smokes inside w/ children  Vaping Use  . Vaping Use: Never used  Substance Use Topics  . Alcohol use: No  . Drug use: No    Family History  Problem Relation Age of Onset  . Healthy Mother   . Hypertension Father   . Migraines Sister     Allergies  Allergen Reactions  . Cephalosporins Hives  . Penicillins Rash    Patient had as a child Has patient had a PCN reaction causing immediate rash, facial/tongue/throat swelling, SOB or lightheadedness with hypotension: no Has patient had a PCN reaction causing severe rash involving mucus membranes or skin necrosis: no Has patient had a PCN reaction that required hospitalization no Has patient had a PCN reaction occurring within the last 10 years: no If all of the above answers are "NO", then may proceed with Cephalosporin use.  Review of Systems: Review of Systems  Constitutional: Negative for chills and fever.  Gastrointestinal: Negative for diarrhea, nausea and vomiting.  Musculoskeletal: Positive for joint pain.  Skin:       + small wound on plantar surface of foot.      Objective: Vitals:   03/07/20 1128  BP: 112/78  Pulse: (!) 101  Weight: 153 lb (69.4 kg)     Body mass index is 27.98 kg/m.  Physical Exam Constitutional:      General: She is not in acute distress.    Appearance: Normal appearance.  Cardiovascular:     Pulses: Normal pulses.  Musculoskeletal:     Comments: Left foot with improved swelling noted.  Healing area at site of debridement with minimal purulence.  Small, pea-sized wound noted on plantar  aspect of left foot at MTP with no significant drainage.  She has tenderness and discomfort with range of motion.   Skin:    General: Skin is warm and dry.  Neurological:     General: No focal deficit present.     Mental Status: She is alert and oriented to person, place, and time.  Psychiatric:        Mood and Affect: Mood normal.        Behavior: Behavior normal.      Pertinent Labs and Microbiology  CBC Latest Ref Rng & Units 03/05/2020 03/03/2020 08/20/2016  WBC 4.0 - 10.5 K/uL 13.0(H) 12.2(H) 8.1  Hemoglobin 12.0 - 15.0 g/dL 12.9 12.3 12.5  Hematocrit 36 - 46 % 39.9 37.9 37.6  Platelets 150 - 400 K/uL 335 345 307   CMP Latest Ref Rng & Units 03/05/2020 08/20/2016 06/23/2016  Glucose 70 - 99 mg/dL 84 90 92  BUN 6 - 20 mg/dL 7 7 7   Creatinine 0.44 - 1.00 mg/dL 0.85 0.72 0.72  Sodium 135 - 145 mmol/L 135 138 136  Potassium 3.5 - 5.1 mmol/L 3.3(L) 3.6 3.6  Chloride 98 - 111 mmol/L 99 107 108  CO2 22 - 32 mmol/L 23 23 24   Calcium 8.9 - 10.3 mg/dL 9.5 9.4 8.7(L)  Total Protein 6.5 - 8.1 g/dL - 7.8 7.0  Total Bilirubin 0.3 - 1.2 mg/dL - 0.5 0.4  Alkaline Phos 38 - 126 U/L - 84 80  AST 15 - 41 U/L - 22 23  ALT 14 - 54 U/L - 15 15     Recent Results (from the past 240 hour(s))  Wound or Superficial Culture     Status: None (Preliminary result)   Collection Time: 03/05/20  3:54 PM   Specimen: Wound  Result Value Ref Range Status   Specimen Description WOUND LEFT FOOT  Final   Special Requests Normal  Final   Gram Stain   Final    MODERATE WBC PRESENT, PREDOMINANTLY PMN FEW GRAM POSITIVE COCCI IN CHAINS    Culture   Final    FEW GROUP B STREP(S.AGALACTIAE)ISOLATED TESTING AGAINST S. AGALACTIAE NOT ROUTINELY PERFORMED DUE TO PREDICTABILITY OF AMP/PEN/VAN SUSCEPTIBILITY. Performed at [facility], [place] [address] [place], [place] [address]    Report Status PENDING  Incomplete  Culture, blood (routine x 2)     Status: None (Preliminary result)   Collection Time: 03/05/20   5:40 PM   Specimen: BLOOD RIGHT ARM  Result Value Ref Range Status   Specimen Description BLOOD RIGHT ARM  Final   Special Requests   Final    BOTTLES DRAWN AEROBIC AND ANAEROBIC Blood Culture results may not be  optimal due to an inadequate volume of blood received in culture bottles   Culture   Final    NO GROWTH < 24 HOURS Performed at [facility] [address], [place], [place] [address]    Report Status PENDING  Incomplete  Culture, blood (routine x 2)     Status: None (Preliminary result)   Collection Time: 03/05/20  6:20 PM   Specimen: BLOOD RIGHT ARM  Result Value Ref Range Status   Specimen Description BLOOD RIGHT ARM  Final   Special Requests   Final    BOTTLES DRAWN AEROBIC AND ANAEROBIC Blood Culture adequate volume   Culture   Final    NO GROWTH < 24 HOURS Performed at [facility], [place] [address], [place], [place] [address]    Report Status PENDING  Incomplete    Imaging FINDINGS: Soft tissue swelling along the dorsum of the foot appears to have improved slightly since prior study. No acute bony abnormality. Specifically, no fracture, subluxation, or dislocation. No bone destruction or erosive changes.  IMPRESSION: No acute bony abnormality.  Assessment and Plan: Screening for HIV (human immunodeficiency virus) Check HIV screen  Encounter for hepatitis C screening test for low risk patient Check HCV screen  Left foot infection She is s/p dalbavancin in the ED on 11/15 with some subjective improvement.  I am concerned for a deeper infection, however, given the presence of small ulcer and the discomfort she has with ROM testing and palpation.  The infusion she received in the ED provides about 2 weeks of coverage so I think empirically treating for now can be delayed until we do further work up.  Plan: -- MRI left foot/toes -- Follow up in 1-2 weeks -- Wound care -- Further plans pending MRI   Orders Placed This Encounter  Procedures   . MR FOOT LEFT W WO CONTRAST    Standing Status:   Future    Standing Expiration Date:   03/07/2021    Order Specific Question:   If indicated for the ordered procedure, I authorize the administration of contrast media per Radiology protocol    Answer:   Yes    Order Specific Question:   What is the patient's sedation requirement?    Answer:   No Sedation    Order Specific Question:   Does the patient have a pacemaker or implanted devices?    Answer:   No    Order Specific Question:   Preferred imaging location?    Answer:   [facility] (table limit - 550 lbs)  . MR TOES LEFT W WO CONTRAST    Standing Status:   Future    Standing Expiration Date:   03/07/2021    Order Specific Question:   If indicated for the ordered procedure, I authorize the administration of contrast media per Radiology protocol    Answer:   Yes    Order Specific Question:   What is the patient's sedation requirement?    Answer:   No Sedation    Order Specific Question:   Does the patient have a pacemaker or implanted devices?    Answer:   No    Order Specific Question:   Preferred imaging location?    Answer:   [facility] (table limit - 550 lbs)  . CBC w/Diff  . Hepatitis C antibody  . HIV Antibody (routine testing w rflx)  . Basic metabolic panel    Order Specific Question:  Has the patient fasted?    Answer:   No      I spent greater than 45 minutes with the patient including greater than 50% of time in face to face counsel of the patient and/or in coordination of their care.   Raynelle Highland for Infectious Disease [facility] 03/07/2020, 12:03 PM

## 2020-03-07 NOTE — Assessment & Plan Note (Signed)
Check HCV screen

## 2020-03-07 NOTE — Patient Instructions (Signed)
Please schedule an appointment with Tammy Sours or [facility] in about 2 weeks for follow up.  I will update you once I have the results of your MRI and blood work.

## 2020-03-07 NOTE — Assessment & Plan Note (Signed)
Check HIV screen

## 2020-03-07 NOTE — Assessment & Plan Note (Signed)
She is s/p dalbavancin in the ED on 11/15 with some subjective improvement.  I am concerned for a deeper infection, however, given the presence of small ulcer and the discomfort she has with ROM testing and palpation.  The infusion she received in the ED provides about 2 weeks of coverage so I think empirically treating for now can be delayed until we do further work up.  Plan: -- MRI left foot/toes -- Follow up in 1-2 weeks -- Wound care -- Further plans pending MRI

## 2020-03-08 ENCOUNTER — Telehealth: Payer: Self-pay

## 2020-03-08 LAB — CBC WITH DIFFERENTIAL/PLATELET
Absolute Monocytes: 664 cells/uL (ref 200–950)
Basophils Absolute: 104 cells/uL (ref 0–200)
Basophils Relative: 1.3 %
Eosinophils Absolute: 376 cells/uL (ref 15–500)
Eosinophils Relative: 4.7 %
HCT: 40.1 % (ref 35.0–45.0)
Hemoglobin: 12.7 g/dL (ref 11.7–15.5)
Lymphs Abs: 3360 cells/uL (ref 850–3900)
MCH: 28.7 pg (ref 27.0–33.0)
MCHC: 31.7 g/dL — ABNORMAL LOW (ref 32.0–36.0)
MCV: 90.7 fL (ref 80.0–100.0)
MPV: 10.1 fL (ref 7.5–12.5)
Monocytes Relative: 8.3 %
Neutro Abs: 3496 cells/uL (ref 1500–7800)
Neutrophils Relative %: 43.7 %
Platelets: 407 10*3/uL — ABNORMAL HIGH (ref 140–400)
RBC: 4.42 10*6/uL (ref 3.80–5.10)
RDW: 10.9 % — ABNORMAL LOW (ref 11.0–15.0)
Total Lymphocyte: 42 %
WBC: 8 10*3/uL (ref 3.8–10.8)

## 2020-03-08 LAB — BASIC METABOLIC PANEL
BUN: 13 mg/dL (ref 7–25)
CO2: 27 mmol/L (ref 20–32)
Calcium: 9.7 mg/dL (ref 8.6–10.2)
Chloride: 102 mmol/L (ref 98–110)
Creat: 1.07 mg/dL (ref 0.50–1.10)
Glucose, Bld: 80 mg/dL (ref 65–99)
Potassium: 4.3 mmol/L (ref 3.5–5.3)
Sodium: 139 mmol/L (ref 135–146)

## 2020-03-08 LAB — HEPATITIS C ANTIBODY
Hepatitis C Ab: NONREACTIVE
SIGNAL TO CUT-OFF: 0.07 (ref <1.00)

## 2020-03-08 LAB — HIV ANTIBODY (ROUTINE TESTING W REFLEX): HIV 1&2 Ab, 4th Generation: NONREACTIVE

## 2020-03-08 NOTE — Telephone Encounter (Signed)
-----   Message from Kathlynn Grate, DO sent at 03/08/2020  2:00 PM EST ----- Can you please let patient know her labs look fine.  Screening HCV and HIV were negative.  WBC has normalized after antibiotic dose in the ED.  I will follow up her MRI that is scheduled and update her then, but she should feel free to call if any worsening before getting that done.

## 2020-03-08 NOTE — Telephone Encounter (Signed)
Patient made aware of results and updated. Patient very appreciative of call.

## 2020-03-10 LAB — CULTURE, BLOOD (ROUTINE X 2)
Culture: NO GROWTH
Culture: NO GROWTH
Special Requests: ADEQUATE

## 2020-03-11 LAB — AEROBIC CULTURE W GRAM STAIN (SUPERFICIAL SPECIMEN): Special Requests: NORMAL

## 2020-03-13 ENCOUNTER — Ambulatory Visit (HOSPITAL_COMMUNITY): Admission: RE | Admit: 2020-03-13 | Payer: Self-pay | Source: Ambulatory Visit

## 2020-03-14 ENCOUNTER — Ambulatory Visit (HOSPITAL_COMMUNITY): Admission: RE | Admit: 2020-03-14 | Payer: Self-pay | Source: Ambulatory Visit

## 2020-03-23 ENCOUNTER — Ambulatory Visit (HOSPITAL_COMMUNITY): Admission: RE | Admit: 2020-03-23 | Payer: Self-pay | Source: Ambulatory Visit

## 2020-03-23 ENCOUNTER — Ambulatory Visit (HOSPITAL_COMMUNITY): Payer: Self-pay

## 2020-04-23 ENCOUNTER — Encounter (HOSPITAL_BASED_OUTPATIENT_CLINIC_OR_DEPARTMENT_OTHER): Payer: Medicaid Other | Attending: Internal Medicine | Admitting: Internal Medicine

## 2020-05-23 ENCOUNTER — Encounter (HOSPITAL_COMMUNITY): Payer: Self-pay | Admitting: Emergency Medicine

## 2020-05-23 ENCOUNTER — Emergency Department (HOSPITAL_COMMUNITY): Payer: Self-pay

## 2020-05-23 ENCOUNTER — Other Ambulatory Visit: Payer: Self-pay

## 2020-05-23 ENCOUNTER — Emergency Department (HOSPITAL_COMMUNITY)
Admission: EM | Admit: 2020-05-23 | Discharge: 2020-05-23 | Disposition: A | Discharge status: Home / Self Care | Payer: Self-pay | Attending: Emergency Medicine | Admitting: Emergency Medicine

## 2020-05-23 DIAGNOSIS — M549 Dorsalgia, unspecified: Secondary | ICD-10-CM

## 2020-05-23 DIAGNOSIS — F17210 Nicotine dependence, cigarettes, uncomplicated: Secondary | ICD-10-CM | POA: Insufficient documentation

## 2020-05-23 DIAGNOSIS — M546 Pain in thoracic spine: Secondary | ICD-10-CM | POA: Insufficient documentation

## 2020-05-23 DIAGNOSIS — M25511 Pain in right shoulder: Secondary | ICD-10-CM | POA: Insufficient documentation

## 2020-05-23 DIAGNOSIS — I10 Essential (primary) hypertension: Secondary | ICD-10-CM | POA: Insufficient documentation

## 2020-05-23 DIAGNOSIS — R0789 Other chest pain: Secondary | ICD-10-CM | POA: Insufficient documentation

## 2020-05-23 LAB — CBC WITH DIFFERENTIAL/PLATELET
Abs Immature Granulocytes: 0.02 10*3/uL (ref 0.00–0.07)
Basophils Absolute: 0.1 10*3/uL (ref 0.0–0.1)
Basophils Relative: 1 %
Eosinophils Absolute: 0.1 10*3/uL (ref 0.0–0.5)
Eosinophils Relative: 1 %
HCT: 38 % (ref 36.0–46.0)
Hemoglobin: 12.7 g/dL (ref 12.0–15.0)
Immature Granulocytes: 0 %
Lymphocytes Relative: 44 %
Lymphs Abs: 4.6 10*3/uL — ABNORMAL HIGH (ref 0.7–4.0)
MCH: 29.9 pg (ref 26.0–34.0)
MCHC: 33.4 g/dL (ref 30.0–36.0)
MCV: 89.4 fL (ref 80.0–100.0)
Monocytes Absolute: 0.6 10*3/uL (ref 0.1–1.0)
Monocytes Relative: 6 %
Neutro Abs: 5.1 10*3/uL (ref 1.7–7.7)
Neutrophils Relative %: 48 %
Platelets: 380 10*3/uL (ref 150–400)
RBC: 4.25 MIL/uL (ref 3.87–5.11)
RDW: 11.8 % (ref 11.5–15.5)
WBC: 10.6 10*3/uL — ABNORMAL HIGH (ref 4.0–10.5)
nRBC: 0 % (ref 0.0–0.2)

## 2020-05-23 LAB — BASIC METABOLIC PANEL
Anion gap: 12 (ref 5–15)
BUN: 12 mg/dL (ref 6–20)
CO2: 22 mmol/L (ref 22–32)
Calcium: 10.1 mg/dL (ref 8.9–10.3)
Chloride: 106 mmol/L (ref 98–111)
Creatinine, Ser: 0.88 mg/dL (ref 0.44–1.00)
GFR, Estimated: >60 mL/min (ref >60)
Glucose, Bld: 110 mg/dL — ABNORMAL HIGH (ref 70–99)
Potassium: 3.1 mmol/L — ABNORMAL LOW (ref 3.5–5.1)
Sodium: 140 mmol/L (ref 135–145)

## 2020-05-23 LAB — D-DIMER, QUANTITATIVE: D-Dimer, Quant: 0.27 ug/mL-FEU (ref 0.00–0.50)

## 2020-05-23 LAB — I-STAT BETA HCG BLOOD, ED (MC, WL, AP ONLY): I-stat hCG, quantitative: <5 m[IU]/mL (ref <5)

## 2020-05-23 MED ORDER — ONDANSETRON HCL 4 MG/2ML IJ SOLN
4.0000 mg | Freq: Once | INTRAMUSCULAR | Status: AC
  Administered 2020-05-23: 4 mg via INTRAVENOUS
  Filled 2020-05-23: qty 2

## 2020-05-23 MED ORDER — SODIUM CHLORIDE 0.9 % IV BOLUS
1000.0000 mL | Freq: Once | INTRAVENOUS | Status: AC
  Administered 2020-05-23: 1000 mL via INTRAVENOUS

## 2020-05-23 MED ORDER — HYDROCODONE-ACETAMINOPHEN 5-325 MG PO TABS: 2.0000 | ORAL_TABLET | ORAL | 0 refills | Status: DC | PRN

## 2020-05-23 MED ORDER — MORPHINE SULFATE (PF) 4 MG/ML IV SOLN
4.0000 mg | Freq: Once | INTRAVENOUS | Status: AC
  Administered 2020-05-23: 4 mg via INTRAVENOUS
  Filled 2020-05-23: qty 1

## 2020-05-23 MED ORDER — IOHEXOL 350 MG/ML SOLN
75.0000 mL | Freq: Once | INTRAVENOUS | Status: AC | PRN
  Administered 2020-05-23: 75 mL via INTRAVENOUS
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
## 2020-05-23 NOTE — ED Triage Notes (Signed)
BIB EMS from home, initially complains of R posterior shoulder pain and then she started to hyperventilate due to anxiety. Endorses increased stress and hx of panic attacks. Denies recent falls or trauma, no previous shoulder injuries.

## 2020-05-23 NOTE — Discharge Instructions (Signed)
Call your primary care doctor or specialist as discussed in the next 2-3 days.   Return immediately back to the ER if:  Your symptoms worsen within the next 12-24 hours. You develop new symptoms such as new fevers, persistent vomiting, new pain, shortness of breath, or new weakness or numbness, or if you have any other concerns.  

## 2021-07-28 ENCOUNTER — Ambulatory Visit (HOSPITAL_COMMUNITY)
Admission: EM | Admit: 2021-07-28 | Discharge: 2021-07-28 | Disposition: A | Discharge status: Home / Self Care | Payer: Medicaid Other | Attending: Nurse Practitioner | Admitting: Nurse Practitioner

## 2021-07-28 ENCOUNTER — Encounter (HOSPITAL_COMMUNITY): Payer: Self-pay

## 2021-07-28 DIAGNOSIS — N3001 Acute cystitis with hematuria: Secondary | ICD-10-CM | POA: Insufficient documentation

## 2021-07-28 DIAGNOSIS — R1030 Lower abdominal pain, unspecified: Secondary | ICD-10-CM | POA: Insufficient documentation

## 2021-07-28 LAB — POCT URINALYSIS DIPSTICK, ED / UC
Glucose, UA: NEGATIVE mg/dL
Ketones, ur: 80 mg/dL — AB
Leukocytes,Ua: NEGATIVE
Nitrite: POSITIVE — AB
Protein, ur: 30 mg/dL — AB
Specific Gravity, Urine: >=1.03 (ref 1.005–1.030)
Urobilinogen, UA: 0.2 mg/dL (ref 0.0–1.0)
pH: 6 (ref 5.0–8.0)

## 2021-07-28 LAB — POC URINE PREG, ED: Preg Test, Ur: NEGATIVE

## 2021-07-28 MED ORDER — SULFAMETHOXAZOLE-TRIMETHOPRIM 800-160 MG PO TABS: 1.0000 | ORAL_TABLET | Freq: Two times a day (BID) | ORAL | 0 refills | Status: AC

## 2021-07-28 MED ORDER — PHENAZOPYRIDINE HCL 200 MG PO TABS: 200.0000 mg | ORAL_TABLET | Freq: Three times a day (TID) | ORAL | 0 refills | Status: DC | PRN

## 2021-07-28 NOTE — ED Provider Notes (Signed)
?[facility] ? ? ? ?CSN: [id number] ?Arrival date & time: 07/28/21  1228 ? ? ?  ? ?History   ?Chief Complaint ?Chief Complaint  ?Patient presents with  ? Constipation  ? ? ?HPI ?Donna Mcclain is a 26 y.o. female.  ? ?Patient presents with more than 1 week history of lower abdominal pain, some nausea and vomiting.  She reports the symptoms started last Saturday and improved last Sunday.  They recurred about 3 days ago.  She denies fevers, body aches, chills, any active vomiting or nausea.  She woke up this morning and there was blood clots on the toilet paper when she wiped.  She denies diarrhea, blood in her stool.  She also denies any new back pain, or inability to eat or drink.  She has also been constipated over the past couple of weeks and took a laxative that helped relieve the constipation last night. ? ?Her last menstrual period finished about 1 week ago and she has regular periods typically. ? ? ?Past Medical History:  ?Diagnosis Date  ? Constipation   ? Hypertension   ? Seasonal allergies   ? ? ?Patient Active Problem List  ? Diagnosis Date Noted  ? Left foot infection 03/07/2020  ? Screening for HIV (human immunodeficiency virus) 03/07/2020  ? Encounter for hepatitis C screening test for low risk patient 03/07/2020  ? Migraine without aura and without status migrainosus, not intractable 07/09/2012  ? Tension headache 07/09/2012  ? Acute abdominal pain 03/24/2011  ? ERYTHRASMA 05/03/2010  ? BREAST PAIN, BILATERAL 05/03/2010  ? HIDRADENITIS SUPPURATIVA 02/08/2009  ? ? ?History reviewed. No pertinent surgical history. ? ?OB History   ?No obstetric history on file. ?  ? ? ? ?Home Medications   ? ?Prior to Admission medications   ?Medication Sig Start Date End Date Taking? Authorizing Provider  ?phenazopyridine (PYRIDIUM) 200 MG tablet Take 1 tablet (200 mg total) by mouth 3 (three) times daily as needed for pain (bladder pain). 07/28/21  Yes Valentino Nose, NP  ?sulfamethoxazole-trimethoprim  (BACTRIM DS) 800-160 MG tablet Take 1 tablet by mouth 2 (two) times daily for 3 days. 07/28/21 07/31/21 Yes Valentino Nose, NP  ? ? ?Family History ?Family History  ?Problem Relation Age of Onset  ? Healthy Mother   ? Hypertension Father   ? Migraines Sister   ? ? ?Social History ?Social History  ? ?Tobacco Use  ? Smoking status: Every Day  ?  Packs/day: 0.50  ?  Types: Cigarettes  ? Smokeless tobacco: Never  ? Tobacco comments:  ?  smokes inside w/ children  ?Vaping Use  ? Vaping Use: Never used  ?Substance Use Topics  ? Alcohol use: No  ? Drug use: No  ? ? ? ?Allergies   ?Cephalosporins and Penicillins ? ? ?Review of Systems ?Review of Systems ?Per HPI ? ?Physical Exam ?Triage Vital Signs ?ED Triage Vitals  ?Enc Vitals Group  ?   BP 07/28/21 1311 134/89  ?   Pulse Rate 07/28/21 1311 96  ?   Resp 07/28/21 1311 20  ?   Temp 07/28/21 1311 97.9 ?F (36.6 ?C)  ?   Temp Source 07/28/21 1311 Oral  ?   SpO2 07/28/21 1311 99 %  ?   Weight --   ?   Height --   ?   Head Circumference --   ?   Peak Flow --   ?   Pain Score 07/28/21 1309 3  ?   Pain  Loc --   ?   Pain Edu? --   ?   Excl. in GC? --   ? ?No data found. ? ?Updated Vital Signs ?BP 134/89 (BP Location: Left Arm)   Pulse 96   Temp 97.9 ?F (36.6 ?C) (Oral)   Resp 20   LMP  (LMP Unknown)   SpO2 99%  ? ?Visual Acuity ?Right Eye Distance:   ?Left Eye Distance:   ?Bilateral Distance:   ? ?Right Eye Near:   ?Left Eye Near:    ?Bilateral Near:    ? ?Physical Exam ?Vitals and nursing note reviewed.  ?Constitutional:   ?   General: She is not in acute distress. ?   Appearance: Normal appearance. She is not toxic-appearing.  ?Pulmonary:  ?   Effort: Pulmonary effort is normal. No respiratory distress.  ?Abdominal:  ?   General: Abdomen is flat. Bowel sounds are normal. There is no distension.  ?   Palpations: Abdomen is soft. There is no mass.  ?   Tenderness: There is abdominal tenderness in the suprapubic area. There is no right CVA tenderness, left CVA tenderness or  guarding.  ?Skin: ?   General: Skin is warm and dry.  ?   Coloration: Skin is not jaundiced or pale.  ?   Findings: No erythema.  ?Neurological:  ?   Mental Status: She is alert and oriented to person, place, and time.  ?   Motor: No weakness.  ?   Gait: Gait normal.  ?Psychiatric:     ?   Mood and Affect: Mood normal.     ?   Behavior: Behavior is cooperative.  ? ? ? ?UC Treatments / Results  ?Labs ?(all labs ordered are listed, but only abnormal results are displayed) ?Labs Reviewed  ?POCT URINALYSIS DIPSTICK, ED / UC - Abnormal; Notable for the following components:  ?    Result Value  ? Bilirubin Urine SMALL (*)   ? Ketones, ur 80 (*)   ? Hgb urine dipstick LARGE (*)   ? Protein, ur 30 (*)   ? Nitrite POSITIVE (*)   ? All other components within normal limits  ?URINE CULTURE  ?POC URINE PREG, ED  ? ? ?EKG ? ? ?Radiology ?No results found. ? ?Procedures ?Procedures (including critical care time) ? ?Medications Ordered in UC ?Medications - No data to display ? ?Initial Impression / Assessment and Plan / UC Course  ?I have reviewed the triage vital signs and the nursing notes. ? ?Pertinent labs & imaging results that were available during my care of the patient were reviewed by me and considered in my medical decision making (see chart for details). ? ?  ?Urinalysis today shows large amount of hemoglobin, nitrites.  Will treat UTI with Bactrim DS twice daily for the 3 days.  She can use Pyridium every 8 hours as needed for bladder pain.  Encouraged drinking plenty of water and avoiding sugary and caffeinated beverages for the next few days.  ER precautions reviewed-she is to seek care if she develops nausea/vomiting and unable to keep fluids down, severe back pain, or high fevers.  The patient was given the opportunity to ask questions.  All questions answered to their satisfaction.  The patient is in agreement to this plan.  ? ?Final Clinical Impressions(s) / UC Diagnoses  ? ?Final diagnoses:  ?Lower abdominal  pain  ?Acute cystitis with hematuria  ? ? ? ?Discharge Instructions   ? ?  ?The urine test today shows  you have an acute urinary tract infection.  Please start the antibiotics (Bactrim) and take the entire course to treat the urinary tract infection.  We will send urine for culture and let you know if we need to change antibiotics tomorrow or Tuesday.  You can use Pyridium for bladder pain (this will diarrhea urine bright orange).  Please seek care if your symptoms worsen and you start developing vomiting and are unable to keep fluids down or high fevers and significant low back pain.  Please try to avoid caffeine and sugary drinks for the next few days. ? ? ? ?ED Prescriptions   ? ? Medication Sig Dispense Auth. Provider  ? sulfamethoxazole-trimethoprim (BACTRIM DS) 800-160 MG tablet Take 1 tablet by mouth 2 (two) times daily for 3 days. 6 tablet Cathlean MarseillesMartinez, Faun Mcqueen A, NP  ? phenazopyridine (PYRIDIUM) 200 MG tablet Take 1 tablet (200 mg total) by mouth 3 (three) times daily as needed for pain (bladder pain). 10 tablet Valentino NoseMartinez, Shilynn Hoch A, NP  ? ?  ? ?PDMP not reviewed this encounter. ?  ?Valentino NoseMartinez, Ormond Lazo A, NP ?07/28/21 1340 ? ?

## 2021-07-28 NOTE — ED Triage Notes (Signed)
Pt presents with c/o lower abdominal pain x 1 week. Pt states the pain was on and off x 1week and states yesterday she felt worse. States she took a laxative thinking she was constipated. Pt states she woke up this morning with vaginal bleeding.  ?

## 2021-07-28 NOTE — Discharge Instructions (Addendum)
The urine test today shows you have an acute urinary tract infection.  Please start the antibiotics (Bactrim) and take the entire course to treat the urinary tract infection.  We will send urine for culture and let you know if we need to change antibiotics tomorrow or Tuesday.  You can use Pyridium for bladder pain (this will diarrhea urine bright orange).  Please seek care if your symptoms worsen and you start developing vomiting and are unable to keep fluids down or high fevers and significant low back pain.  Please try to avoid caffeine and sugary drinks for the next few days. ? ?

## 2021-07-30 ENCOUNTER — Emergency Department (HOSPITAL_BASED_OUTPATIENT_CLINIC_OR_DEPARTMENT_OTHER)
Admission: EM | Admit: 2021-07-30 | Discharge: 2021-07-30 | Disposition: A | Discharge status: Home / Self Care | Payer: Self-pay | Attending: Emergency Medicine | Admitting: Emergency Medicine

## 2021-07-30 ENCOUNTER — Encounter (HOSPITAL_BASED_OUTPATIENT_CLINIC_OR_DEPARTMENT_OTHER): Payer: Self-pay | Admitting: Obstetrics and Gynecology

## 2021-07-30 ENCOUNTER — Emergency Department (HOSPITAL_BASED_OUTPATIENT_CLINIC_OR_DEPARTMENT_OTHER): Payer: Self-pay

## 2021-07-30 ENCOUNTER — Other Ambulatory Visit: Payer: Self-pay

## 2021-07-30 DIAGNOSIS — R1030 Lower abdominal pain, unspecified: Secondary | ICD-10-CM | POA: Insufficient documentation

## 2021-07-30 DIAGNOSIS — R319 Hematuria, unspecified: Secondary | ICD-10-CM | POA: Insufficient documentation

## 2021-07-30 DIAGNOSIS — R197 Diarrhea, unspecified: Secondary | ICD-10-CM | POA: Insufficient documentation

## 2021-07-30 DIAGNOSIS — R112 Nausea with vomiting, unspecified: Secondary | ICD-10-CM | POA: Insufficient documentation

## 2021-07-30 LAB — URINALYSIS, ROUTINE W REFLEX MICROSCOPIC
Bilirubin Urine: NEGATIVE
Glucose, UA: NEGATIVE mg/dL
Ketones, ur: NEGATIVE mg/dL
Leukocytes,Ua: NEGATIVE
Nitrite: NEGATIVE
RBC / HPF: >50 RBC/hpf — ABNORMAL HIGH (ref 0–5)
Specific Gravity, Urine: 1.01 (ref 1.005–1.030)
pH: 6.5 (ref 5.0–8.0)

## 2021-07-30 LAB — COMPREHENSIVE METABOLIC PANEL
ALT: 7 U/L (ref 0–44)
AST: 13 U/L — ABNORMAL LOW (ref 15–41)
Albumin: 4.4 g/dL (ref 3.5–5.0)
Alkaline Phosphatase: 51 U/L (ref 38–126)
Anion gap: 10 (ref 5–15)
BUN: 11 mg/dL (ref 6–20)
CO2: 27 mmol/L (ref 22–32)
Calcium: 9.4 mg/dL (ref 8.9–10.3)
Chloride: 101 mmol/L (ref 98–111)
Creatinine, Ser: 0.89 mg/dL (ref 0.44–1.00)
GFR, Estimated: >60 mL/min (ref >60)
Glucose, Bld: 94 mg/dL (ref 70–99)
Potassium: 3.1 mmol/L — ABNORMAL LOW (ref 3.5–5.1)
Sodium: 138 mmol/L (ref 135–145)
Total Bilirubin: 0.6 mg/dL (ref 0.3–1.2)
Total Protein: 7.7 g/dL (ref 6.5–8.1)

## 2021-07-30 LAB — URINE CULTURE: Culture: 80000 — AB

## 2021-07-30 LAB — CBC
HCT: 35.4 % — ABNORMAL LOW (ref 36.0–46.0)
Hemoglobin: 11.7 g/dL — ABNORMAL LOW (ref 12.0–15.0)
MCH: 29.2 pg (ref 26.0–34.0)
MCHC: 33.1 g/dL (ref 30.0–36.0)
MCV: 88.3 fL (ref 80.0–100.0)
Platelets: 381 10*3/uL (ref 150–400)
RBC: 4.01 MIL/uL (ref 3.87–5.11)
RDW: 11.5 % (ref 11.5–15.5)
WBC: 10 10*3/uL (ref 4.0–10.5)
nRBC: 0 % (ref 0.0–0.2)

## 2021-07-30 LAB — LIPASE, BLOOD: Lipase: <10 U/L — ABNORMAL LOW (ref 11–51)

## 2021-07-30 LAB — PREGNANCY, URINE: Preg Test, Ur: NEGATIVE

## 2021-07-30 MED ORDER — POTASSIUM CHLORIDE CRYS ER 20 MEQ PO TBCR
40.0000 meq | EXTENDED_RELEASE_TABLET | Freq: Once | ORAL | Status: AC
  Administered 2021-07-30: 40 meq via ORAL
  Filled 2021-07-30: qty 2

## 2021-07-30 MED ORDER — MORPHINE SULFATE (PF) 4 MG/ML IV SOLN
4.0000 mg | Freq: Once | INTRAVENOUS | Status: AC
  Administered 2021-07-30: 4 mg via INTRAVENOUS
  Filled 2021-07-30: qty 1

## 2021-07-30 MED ORDER — ONDANSETRON HCL 4 MG/2ML IJ SOLN
4.0000 mg | Freq: Once | INTRAMUSCULAR | Status: AC
  Administered 2021-07-30: 4 mg via INTRAVENOUS
  Filled 2021-07-30: qty 2

## 2021-07-30 NOTE — Discharge Instructions (Addendum)
Please call to make an appointment with Dr. Berneice Heinrich.  He is a urologist who can further evaluate the blood in your urine.  You may stop taking the antibiotic previously prescribed to you.  Your urine does not appear infected today and at this time we will hold off on further antibiotic treatment due to the side effects that you are experiencing. ?

## 2021-07-30 NOTE — ED Triage Notes (Signed)
Patient reports to the ER for pelvic pain. Patient reports she was seen a few days ago by [facility] for a UTI. Patient reports she woke up this morning nauseated and having emesis with a significant increase in pain. Patient reports blood in her urine.  ?

## 2021-07-30 NOTE — ED Notes (Signed)
Discharge instructions and follow up care reviewed and explained. Pt verbalized understanding. 

## 2021-07-30 NOTE — ED Provider Notes (Signed)
?[facility] EMERGENCY DEPT ?Provider Note ? ? ?CSN: [id number] ?Arrival date & time: 07/30/21  1036 ? ?  ? ?History ? ?Chief Complaint  ?Patient presents with  ? Pelvic Pain  ? ? ?Donna Mcclain is a 26 y.o. female presenting today due to lower abdominal pain, nausea, vomiting and hematuria.  She reports that for nearly a week she had some abdominal pain, nausea and vomiting.  The symptoms decreased for 2 days and then returned.  This prompted an urgent care visit on 4/9 where she was diagnosed for a potential bladder infection.  She reports ever since starting Bactrim she has started to have increased nausea, vomiting and some diarrhea.  Says that she is taking Pyridium however she can tell that there is still blood in her urine when she wipes.  Denies vaginal cramping, last menstrual period at the end of March and normal. ? ? ?Pelvic Pain ?Associated symptoms include abdominal pain.  ? ?  ? ?Home Medications ?Prior to Admission medications   ?Medication Sig Start Date End Date Taking? Authorizing Provider  ?phenazopyridine (PYRIDIUM) 200 MG tablet Take 1 tablet (200 mg total) by mouth 3 (three) times daily as needed for pain (bladder pain). 07/28/21   Valentino NoseMartinez, Jessica A, NP  ?sulfamethoxazole-trimethoprim (BACTRIM DS) 800-160 MG tablet Take 1 tablet by mouth 2 (two) times daily for 3 days. 07/28/21 07/31/21  Valentino NoseMartinez, Jessica A, NP  ?   ? ?Allergies    ?Cephalosporins and Penicillins   ? ?Review of Systems   ?Review of Systems  ?Constitutional:  Positive for chills. Negative for fever.  ?Gastrointestinal:  Positive for abdominal pain, diarrhea, nausea and vomiting.  ?Genitourinary:  Positive for dysuria and hematuria. Negative for pelvic pain, vaginal bleeding, vaginal discharge and vaginal pain.  ? ?Physical Exam ?Updated Vital Signs ?BP 126/87 (BP Location: Left Arm)   Pulse 76   Temp 98.3 ?F (36.8 ?C)   Resp 18   Ht 5\' 2"  (1.575 m)   Wt 65.8 kg   LMP 07/10/2021 (Exact Date)   SpO2 97%   BMI  26.52 kg/m?  ?Physical Exam ?Vitals and nursing note reviewed.  ?Constitutional:   ?   Appearance: Normal appearance. She is not ill-appearing.  ?HENT:  ?   Head: Normocephalic and atraumatic.  ?Eyes:  ?   General: No scleral icterus. ?   Conjunctiva/sclera: Conjunctivae normal.  ?Cardiovascular:  ?   Rate and Rhythm: Normal rate.  ?Pulmonary:  ?   Effort: Pulmonary effort is normal. No respiratory distress.  ?Abdominal:  ?   General: Abdomen is flat.  ?   Tenderness: There is abdominal tenderness (Suprapubic). There is no right CVA tenderness or left CVA tenderness.  ?Skin: ?   General: Skin is warm and dry.  ?   Findings: No rash.  ?Neurological:  ?   Mental Status: She is alert.  ?Psychiatric:     ?   Mood and Affect: Mood normal.  ? ? ?ED Results / Procedures / Treatments   ?Labs ?(all labs ordered are listed, but only abnormal results are displayed) ?Labs Reviewed  ?CBC - Abnormal; Notable for the following components:  ?    Result Value  ? Hemoglobin 11.7 (*)   ? HCT 35.4 (*)   ? All other components within normal limits  ?URINALYSIS, ROUTINE W REFLEX MICROSCOPIC - Abnormal; Notable for the following components:  ? Hgb urine dipstick LARGE (*)   ? Protein, ur TRACE (*)   ? RBC / HPF >50 (*)   ?  All other components within normal limits  ?PREGNANCY, URINE  ?LIPASE, BLOOD  ?COMPREHENSIVE METABOLIC PANEL  ? ? ?EKG ?None ? ?Radiology ?EXAM:  ?CT ABDOMEN AND PELVIS WITHOUT CONTRAST  ?   ?TECHNIQUE:  ?Multidetector CT imaging of the abdomen and pelvis was performed  ?following the standard protocol without IV contrast.  ?   ?RADIATION DOSE REDUCTION: This exam was performed according to the  ?departmental dose-optimization program which includes automated  ?exposure control, adjustment of the mA and/or kV according to  ?patient size and/or use of iterative reconstruction technique.  ?   ?COMPARISON:  04/29/2013  ?   ?FINDINGS:  ?Lower chest: Lung bases clear  ?   ?Hepatobiliary: Gallbladder and liver normal  appearance  ?   ?Pancreas: Normal appearance  ?   ?Spleen: Normal appearance  ?   ?Adrenals/Urinary Tract: Adrenal glands, kidneys, ureters, and  ?bladder normal appearance. No renal mass, hydronephrosis, or urinary  ?tract calcification.  ?   ?Stomach/Bowel: Normal appendix. Stomach and bowel loops normal  ?appearance  ?   ?Vascular/Lymphatic: Aorta normal caliber. No adenopathy. Few pelvic  ?phleboliths.  ?   ?Reproductive: Unremarkable uterus and adnexa  ?   ?Other: No free air or free fluid. Tiny umbilical hernia containing  ?fat, unchanged. No inflammatory process.  ?   ?Musculoskeletal: Unremarkable  ?   ?IMPRESSION:  ?Tiny umbilical hernia containing fat.  ?   ?Otherwise negative exam.  ?   ? ? ?Procedures ?Procedures  ? ? ?Medications Ordered in ED ?Medications  ?morphine (PF) 4 MG/ML injection 4 mg (4 mg Intravenous Given 07/30/21 1147)  ?ondansetron (ZOFRAN) injection 4 mg (4 mg Intravenous Given 07/30/21 1147)  ?potassium chloride SA (KLOR-CON M) CR tablet 40 mEq (40 mEq Oral Given 07/30/21 1209)  ? ? ?ED Course/ Medical Decision Making/ A&P ?  ?                        ?Medical Decision Making ?Amount and/or Complexity of Data Reviewed ?Labs: ordered. ?Radiology: ordered. ? ?Risk ?Prescription drug management. ? ? ?Patient presents to the ED for concern of pelvic pain.  Differential includes but is not limited to uterine fibroids, pregnancy, ectopic pregnancy, ovarian cyst, ovarian torsion, pyelonephritis, cystitis. ? ?Per internal/external chart review: I am able to see patient's visit to urgent care on 4/9.  Her urine sample showed nitrites and hematuria at that time.  She was treated with Pyridium and Bactrim. ? ? ?I performed a full physical exam, pertinent findings include: ?Suprapubic tenderness ? ?Diagnostics: ? ?I ordered and viewed labs. The pertinent results include:  ?Potassium 3.1 ?Gross hematuria ? ?I ordered and individually viewed patient's CT renal.  There were no signs of  nephrolithiasis ? ? ?Test Considered: Pelvic ultrasound and pelvic exam.  Patient did not believe she needed to have these exams done as her pain feels more abdominal and lower back.  Not currently sexually active, denies vaginal symptoms. ? ? ?Treatment: ? ?Patient given potassium to replete her hypokalemia ?Given morphine for her pain ?Given Zofran for her nausea ? ? ?MDM/Disposition: ? ?26 year old female presenting with nausea, vomiting and diarrhea after being started on Bactrim 3 days ago.  She continues to complain of hematuria, which is what prompted her original visit to urgent care in which she was prescribed Bactrim.  Patient also complained of back pain so a CT renal was ordered.  This was negative.  Patient has no vaginal symptoms, is not currently sexually active  and has a negative pregnancy in our department today.  Pelvic exam was deferred. I reevaluated the patient and found that they have :Improved.  She denies any current symptoms.  We discussed her hematuria and the need to follow-up with her primary care provider as well as a urologist who may need to do a cystoscope to find an etiology for her hematuria. She is agreeable to the above plan and we discussed return precautions extensively.  She was given referrals and left the department thankful for her care. ? ? ?Final Clinical Impression(s) / ED Diagnoses ?Final diagnoses:  ?Hematuria, unspecified type  ? ? ?Rx / DC Orders ? ? ?Results and diagnoses were explained to the patient. Return precautions discussed in full. Patient had no additional questions and expressed complete understanding. ? ? ?This chart was dictated using voice recognition software.  Despite best efforts to proofread,  errors can occur which can change the documentation meaning.  ?  ?Saddie Benders, PA-C ?08/01/21 1859 ? ?  ?Vanetta Mulders, MD ?08/05/21 2333 ? ?

## 2021-08-05 ENCOUNTER — Emergency Department (HOSPITAL_BASED_OUTPATIENT_CLINIC_OR_DEPARTMENT_OTHER): Payer: Self-pay | Admitting: Radiology

## 2021-08-05 ENCOUNTER — Encounter (HOSPITAL_BASED_OUTPATIENT_CLINIC_OR_DEPARTMENT_OTHER): Payer: Self-pay | Admitting: Radiology

## 2021-08-05 ENCOUNTER — Emergency Department (HOSPITAL_BASED_OUTPATIENT_CLINIC_OR_DEPARTMENT_OTHER): Payer: Self-pay

## 2021-08-05 ENCOUNTER — Emergency Department (HOSPITAL_BASED_OUTPATIENT_CLINIC_OR_DEPARTMENT_OTHER)
Admission: EM | Admit: 2021-08-05 | Discharge: 2021-08-05 | Disposition: A | Discharge status: Home / Self Care | Payer: Self-pay | Attending: Emergency Medicine | Admitting: Emergency Medicine

## 2021-08-05 ENCOUNTER — Other Ambulatory Visit: Payer: Self-pay

## 2021-08-05 DIAGNOSIS — M25511 Pain in right shoulder: Secondary | ICD-10-CM | POA: Insufficient documentation

## 2021-08-05 DIAGNOSIS — F17200 Nicotine dependence, unspecified, uncomplicated: Secondary | ICD-10-CM | POA: Insufficient documentation

## 2021-08-05 DIAGNOSIS — R0789 Other chest pain: Secondary | ICD-10-CM | POA: Insufficient documentation

## 2021-08-05 DIAGNOSIS — R0602 Shortness of breath: Secondary | ICD-10-CM | POA: Insufficient documentation

## 2021-08-05 DIAGNOSIS — N939 Abnormal uterine and vaginal bleeding, unspecified: Secondary | ICD-10-CM | POA: Insufficient documentation

## 2021-08-05 DIAGNOSIS — I10 Essential (primary) hypertension: Secondary | ICD-10-CM | POA: Insufficient documentation

## 2021-08-05 LAB — URINALYSIS, ROUTINE W REFLEX MICROSCOPIC
Bilirubin Urine: NEGATIVE
Glucose, UA: NEGATIVE mg/dL
Ketones, ur: NEGATIVE mg/dL
Nitrite: NEGATIVE
Specific Gravity, Urine: 1.019 (ref 1.005–1.030)
pH: 7 (ref 5.0–8.0)

## 2021-08-05 LAB — BASIC METABOLIC PANEL
Anion gap: 8 (ref 5–15)
BUN: 10 mg/dL (ref 6–20)
CO2: 27 mmol/L (ref 22–32)
Calcium: 10.1 mg/dL (ref 8.9–10.3)
Chloride: 102 mmol/L (ref 98–111)
Creatinine, Ser: 0.67 mg/dL (ref 0.44–1.00)
GFR, Estimated: >60 mL/min (ref >60)
Glucose, Bld: 87 mg/dL (ref 70–99)
Potassium: 4.1 mmol/L (ref 3.5–5.1)
Sodium: 137 mmol/L (ref 135–145)

## 2021-08-05 LAB — CBC
HCT: 35.2 % — ABNORMAL LOW (ref 36.0–46.0)
Hemoglobin: 11.6 g/dL — ABNORMAL LOW (ref 12.0–15.0)
MCH: 29.1 pg (ref 26.0–34.0)
MCHC: 33 g/dL (ref 30.0–36.0)
MCV: 88.2 fL (ref 80.0–100.0)
Platelets: 434 10*3/uL — ABNORMAL HIGH (ref 150–400)
RBC: 3.99 MIL/uL (ref 3.87–5.11)
RDW: 11.7 % (ref 11.5–15.5)
WBC: 12.6 10*3/uL — ABNORMAL HIGH (ref 4.0–10.5)
nRBC: 0 % (ref 0.0–0.2)

## 2021-08-05 LAB — WET PREP, GENITAL
Clue Cells Wet Prep HPF POC: NONE SEEN
Sperm: NONE SEEN
WBC, Wet Prep HPF POC: <10 (ref <10)
Yeast Wet Prep HPF POC: NONE SEEN

## 2021-08-05 LAB — D-DIMER, QUANTITATIVE: D-Dimer, Quant: 2.41 ug/mL-FEU — ABNORMAL HIGH (ref 0.00–0.50)

## 2021-08-05 LAB — TROPONIN I (HIGH SENSITIVITY): Troponin I (High Sensitivity): <2 ng/L (ref <18)

## 2021-08-05 LAB — HCG, QUANTITATIVE, PREGNANCY: hCG, Beta Chain, Quant, S: <1 m[IU]/mL (ref <5)

## 2021-08-05 MED ORDER — MORPHINE SULFATE (PF) 4 MG/ML IV SOLN
4.0000 mg | Freq: Once | INTRAVENOUS | Status: AC
  Administered 2021-08-05: 4 mg via INTRAVENOUS
  Filled 2021-08-05: qty 1

## 2021-08-05 MED ORDER — NITROFURANTOIN MONOHYD MACRO 100 MG PO CAPS: 100.0000 mg | ORAL_CAPSULE | Freq: Two times a day (BID) | ORAL | 0 refills | Status: DC

## 2021-08-05 MED ORDER — IOHEXOL 350 MG/ML SOLN
100.0000 mL | Freq: Once | INTRAVENOUS | Status: AC | PRN
  Administered 2021-08-05: 75 mL via INTRAVENOUS

## 2021-08-05 MED ORDER — ONDANSETRON HCL 4 MG/2ML IJ SOLN
4.0000 mg | Freq: Once | INTRAMUSCULAR | Status: AC
  Administered 2021-08-05: 4 mg via INTRAVENOUS
  Filled 2021-08-05: qty 2

## 2021-08-05 NOTE — ED Notes (Signed)
Patient transported to Ultrasound 

## 2021-08-05 NOTE — Discharge Instructions (Addendum)
Please take care to follow-up with urology and OB/GYN for persistent symptoms.  Follow antibiotic course for suspected persistent UTI.  Follow results on MyChart for GC/chlamydia and wet prep results for potential further medical treatment.  Please return to emergency department if alarming signs/symptoms we discussed become apparent. ?

## 2021-08-05 NOTE — ED Notes (Signed)
EDP made aware of D-Dimer ? ?

## 2021-08-05 NOTE — ED Notes (Signed)
ED Provider at bedside. 

## 2021-08-05 NOTE — ED Triage Notes (Signed)
Pt to ED c/o [facility] and chest pain that started this morning and constant in nature.  Also reports right shoulder pain.  ? ?Reports recently dx with UTI and treated with ABX.  ?

## 2021-08-05 NOTE — ED Provider Notes (Signed)
?[facility] EMERGENCY DEPT ?Provider Note ? ? ?CSN: [id number] ?Arrival date & time: 08/05/21  1344 ? ?  ? ?History ? ?Chief Complaint  ?Patient presents with  ? Shoulder Pain  ?  right  ? Chest Pain  ? Shortness of Breath  ? ? ?Donna Mcclain is a 26 y.o. female. ? ? ?Shoulder Pain ?Associated symptoms: no fatigue and no fever   ?Chest Pain ?Associated symptoms: abdominal pain and shortness of breath   ?Associated symptoms: no cough, no diaphoresis, no dizziness, no fatigue, no fever, no headache, no nausea, no numbness and no vomiting   ?Shortness of Breath ?Associated symptoms: abdominal pain and chest pain   ?Associated symptoms: no cough, no diaphoresis, no fever, no headaches, no rash, no vomiting and no wheezing   ? ?26 year old female presents to the emergency department with complaints of right shoulder pain, chest pain, abdominal pain, vaginal bleeding, urinary bleeding.  Patient went to the urgent care on 4/9 with lower abdominal pain on Friday and was told it was a UTI.  She was treated with Bactrim and Pyridium.  Sunday she developed nausea and vomiting and came into the emergency department on 4/11.  On that visit, work-up was largely negative for any acute pathology besides gross hematuria and a potassium of 3.1; she was advised to stop Bactrim without replacement antibiotic for treatment of UTI.  She states that chest pain is worse with pressure.  It feels sharp in nature and ranks it a 8-9 out of 10.  Vaginal bleeding has been persistent for the past 9 days.  Her last menstrual period ended 07/14/2021.  Patient also complains of urinary bleeding persistent since her urgent care visit.  She confirms dysuria and lower abdominal discomfort.  Right shoulder pain described as dull in nature.  She denies vomiting, diarrhea, constipation, chills, recent sickness exposure/illness, cough.  Trauma to right shoulder or fall.   ? ?Home Medications ?Prior to Admission medications   ?Medication  Sig Start Date End Date Taking? Authorizing Provider  ?phenazopyridine (PYRIDIUM) 200 MG tablet Take 1 tablet (200 mg total) by mouth 3 (three) times daily as needed for pain (bladder pain). 07/28/21   Eulogio Bear, NP  ?   ? ?Allergies    ?Cephalosporins and Penicillins   ? ?Review of Systems   ?Review of Systems  ?Constitutional:  Negative for chills, diaphoresis, fatigue and fever.  ?HENT:  Negative for congestion, postnasal drip, rhinorrhea and sinus pain.   ?Respiratory:  Positive for shortness of breath. Negative for apnea, cough, chest tightness and wheezing.   ?Cardiovascular:  Positive for chest pain.  ?Gastrointestinal:  Positive for abdominal pain. Negative for abdominal distention, blood in stool, constipation, diarrhea, nausea and vomiting.  ?     Diffuse lower abdominal pain.  ?Genitourinary:  Positive for dysuria, hematuria and vaginal bleeding. Negative for decreased urine volume, menstrual problem, urgency, vaginal discharge and vaginal pain.  ?Musculoskeletal:   ?     Diffuse nonspecific right shoulder pain  ?Skin:  Negative for color change, pallor, rash and wound.  ?Neurological:  Negative for dizziness, seizures, syncope, light-headedness, numbness and headaches.  ?Psychiatric/Behavioral:  Negative for confusion and hallucinations.   ? ?Physical Exam ?Updated Vital Signs ?BP (!) 126/91   Pulse 78   Temp 98.3 ?F (36.8 ?C)   Resp 13   Ht 5\' 2"  (1.575 m)   Wt 65.8 kg   LMP 07/10/2021 (Exact Date)   SpO2 100%   BMI 26.52 kg/m?  ?Physical  Exam ?Vitals and nursing note reviewed.  ?Constitutional:   ?   Appearance: She is well-developed and normal weight.  ?HENT:  ?   Head: Normocephalic.  ?Cardiovascular:  ?   Rate and Rhythm: Normal rate and regular rhythm.  ?   Heart sounds: Normal heart sounds.  ?Pulmonary:  ?   Breath sounds: Normal breath sounds.  ?Chest:  ?   Chest wall: Tenderness present. No mass, deformity or crepitus.  ?   Comments: Midline sternal chest pain.  Reproducible on  exam with palpation.  No observable defect including erythema, mass, induration, fluctuance ?Abdominal:  ?   General: Bowel sounds are normal.  ?   Palpations: Abdomen is soft.  ?   Tenderness: There is abdominal tenderness. There is guarding. There is no right CVA tenderness, left CVA tenderness or rebound. Positive signs include Murphy's sign. Negative signs include McBurney's sign and obturator sign.  ?   Comments: Abdominal exam significant for diffuse lower abdominal pain.  CT on 4/11 significant for small umbilical hernia but not observable upon exam.  Guarding noted on exam.  Significant tenderness upon palpation of right upper quadrant.  Murphy sign positive.   ?Musculoskeletal:  ?   Right lower leg: No edema.  ?   Left lower leg: No edema.  ?   Comments: Right shoulder pain.  Patient with full and ROM.  No weakness multitrack 5/5 bilaterally neurovascularly intact radial and ulnar pulses 2+ to.  Cap refill less than 2 seconds.  Denies trauma.  No observable sign of erythema, lesions, induration.  Pain is not exacerbated by movement or with palpation.  ?Skin: ?   General: Skin is warm and dry.  ?   Capillary Refill: Capillary refill takes less than 2 seconds.  ?Neurological:  ?   General: No focal deficit present.  ?   Mental Status: She is alert and oriented to person, place, and time.  ?Psychiatric:     ?   Mood and Affect: Mood normal.     ?   Behavior: Behavior normal.  ? ? ?ED Results / Procedures / Treatments   ?Labs ?(all labs ordered are listed, but only abnormal results are displayed) ?Labs Reviewed  ?CBC - Abnormal; Notable for the following components:  ?    Result Value  ? WBC 12.6 (*)   ? Hemoglobin 11.6 (*)   ? HCT 35.2 (*)   ? Platelets 434 (*)   ? All other components within normal limits  ?BASIC METABOLIC PANEL  ?PREGNANCY, URINE  ?URINALYSIS, ROUTINE W REFLEX MICROSCOPIC  ?TROPONIN I (HIGH SENSITIVITY)  ?TROPONIN I (HIGH SENSITIVITY)  ? ? ?EKG ?None ? ?Radiology ?No results  found. ? ?Procedures ?Procedures  ? ? ?Medications Ordered in ED ?Medications - No data to display ? ?ED Course/ Medical Decision Making/ A&P ?Clinical Course as of 08/05/21 1806  ?Mon Aug 05, 2021  ?1638 Upon reevaluation of the patient, she was tachypneic and in increasing pain.  Abdominal exam was significant for exquisite right upper quadrant tenderness as well as persistent midsternal chest pain.  Right upper quadrant ultrasound ordered [CR]  ?1722 D-dimer resulted and CT angio chest ordered once seen. [CR]  ?  ?Clinical Course User Index ?[CR] Wilnette Kales, PA  ? ?                        ?Medical Decision Making ?Amount and/or Complexity of Data Reviewed ?Labs: ordered. ? ? ?This patient presents to  the ED for concern of shortness of breath, chest pain, abdominal pain, this involves an extensive number of treatment options, and is a complaint that carries with it a high risk of complications and morbidity.  The differential diagnosis includes ACS, PE, cholecystitis or other gallbladder pathology, Rochele Raring, UTI/pyelonephritis, nephrolithiasis, ectopic pregnancy, ovarian torsion, appendicitis, ovarian mass ? ? ?Co morbidities that complicate the patient evaluation ? ?None ? ? ?Additional history obtained: ? ?Additional history obtained from urgent care record/9/20 ?External records from outside source obtained and reviewed including prior UTI treatment and emergency department course of action for patient. ? ? ?Lab Tests: ? ?I Ordered, and personally interpreted labs.  The pertinent results include: D-dimer 2.41, white blood count 12.6, hemoglobin 11.6 ? ? ?Imaging Studies ordered: ? ?I ordered imaging studies including chest x-ray, CT angio chest, ultrasound abdomen right upper quadrant ?I independently visualized and interpreted imaging which showed  ?chest x-ray: No acute cardiopulmonary abnormalities ?Ultrasound abdomen right upper quadrant: No acute abnormalities-normal right upper quadrant  ultrasound ?CT angio chest: Negative for PE or acute cardiopulmonary abnormality, right right subclavian artery, mild thymic hypoplasia  ?I agree with the radiologist interpretation ? ? ?Cardiac Monitoring: / EKG: ? ?The patient w

## 2021-08-05 NOTE — ED Notes (Signed)
Discharge paperwork given and understood. 

## 2021-08-06 LAB — GC/CHLAMYDIA PROBE AMP (~~LOC~~) NOT AT ARMC
Chlamydia: NEGATIVE
Comment: NEGATIVE
Comment: NORMAL
Neisseria Gonorrhea: POSITIVE — AB

## 2021-08-09 ENCOUNTER — Encounter (HOSPITAL_COMMUNITY): Payer: Self-pay

## 2021-08-09 ENCOUNTER — Emergency Department (HOSPITAL_COMMUNITY)
Admission: EM | Admit: 2021-08-09 | Discharge: 2021-08-09 | Disposition: A | Discharge status: Home / Self Care | Payer: Medicaid Other | Attending: Emergency Medicine | Admitting: Emergency Medicine

## 2021-08-09 ENCOUNTER — Other Ambulatory Visit: Payer: Self-pay

## 2021-08-09 DIAGNOSIS — Z202 Contact with and (suspected) exposure to infections with a predominantly sexual mode of transmission: Secondary | ICD-10-CM

## 2021-08-09 DIAGNOSIS — A599 Trichomoniasis, unspecified: Secondary | ICD-10-CM | POA: Insufficient documentation

## 2021-08-09 DIAGNOSIS — R300 Dysuria: Secondary | ICD-10-CM | POA: Insufficient documentation

## 2021-08-09 DIAGNOSIS — A549 Gonococcal infection, unspecified: Secondary | ICD-10-CM | POA: Insufficient documentation

## 2021-08-09 DIAGNOSIS — R112 Nausea with vomiting, unspecified: Secondary | ICD-10-CM | POA: Insufficient documentation

## 2021-08-09 DIAGNOSIS — R000 Tachycardia, unspecified: Secondary | ICD-10-CM | POA: Insufficient documentation

## 2021-08-09 MED ORDER — GENTAMICIN SULFATE 40 MG/ML IJ SOLN
240.0000 mg | Freq: Once | INTRAMUSCULAR | Status: AC
  Administered 2021-08-09: 240 mg via INTRAMUSCULAR
  Filled 2021-08-09: qty 6

## 2021-08-09 MED ORDER — ONDANSETRON 4 MG PO TBDP
8.0000 mg | ORAL_TABLET | Freq: Once | ORAL | Status: AC
  Administered 2021-08-09: 8 mg via ORAL
  Filled 2021-08-09: qty 2

## 2021-08-09 MED ORDER — METRONIDAZOLE 500 MG PO TABS: 500.0000 mg | ORAL_TABLET | Freq: Two times a day (BID) | ORAL | 0 refills | Status: DC

## 2021-08-09 MED ORDER — DOXYCYCLINE HYCLATE 100 MG PO CAPS: 100.0000 mg | ORAL_CAPSULE | Freq: Two times a day (BID) | ORAL | 0 refills | Status: DC

## 2021-08-09 MED ORDER — ONDANSETRON HCL 4 MG PO TABS: 4.0000 mg | ORAL_TABLET | Freq: Three times a day (TID) | ORAL | 0 refills | Status: DC | PRN

## 2021-08-09 NOTE — Discharge Instructions (Addendum)
Please return to the ED with any new symptoms such as fevers, nausea or vomiting, vaginal discharge, pelvic pain ?Please follow-up with PCP.  If you do not have a PCP, I have referred you to 1.  You need to call the number on this document to make appoint to be seen ?Please take antibiotics to completion.  Please do not drink while on metronidazole. ?Please read the attached informational guides concerning trichomoniasis as well as gonorrhea ?Please make all attempts to use condoms in the future ?

## 2021-08-09 NOTE — ED Provider Notes (Signed)
?[facility] EMERGENCY DEPARTMENT ?Provider Note ? ? ?CSN: [id number] ?Arrival date & time: 08/09/21  1140 ? ?  ? ?History ? ?Chief Complaint  ?Patient presents with  ? Exposure to STD  ? ? ?Donna Mcclain is a 26 y.o. female with no medical history.  The patient presents to the ED for treatment for suspect gonorrheal infection.  Patient states that on 4/9, she was seen in urgent care and diagnosed with UTI.  Patient placed on Bactrim.  Patient took this medication but had continued symptoms.  Patient presented to ED on 4/11 and was taken off Bactrim but not placed on replacement antibiotic.  Patient presented to ED on 4/17 with chest pain as well as continued abdominal and pelvic pain.  Patient had pelvic exam conducted and was tested with wet prep as well as GC chlamydia screening.  Patient trichomoniasis and gonorrhea results were positive.  Patient presents to ED for treatment.  Patient endorsing dysuria however denies vaginal discharge, fevers, pelvic pain, vaginal bleeding.  Patient also endorsing nausea and vomiting.  Denies body aches or chills. ? ? ?Exposure to STD ?Pertinent negatives include no abdominal pain.  ? ?  ? ?Home Medications ?Prior to Admission medications   ?Medication Sig Start Date End Date Taking? Authorizing Provider  ?doxycycline (VIBRAMYCIN) 100 MG capsule Take 1 capsule (100 mg total) by mouth 2 (two) times daily. 08/09/21  Yes Al Decant, PA-C  ?ibuprofen (ADVIL) 200 MG tablet Take 400 mg by mouth every 6 (six) hours as needed for headache or mild pain.   Yes [provider]  ?metroNIDAZOLE (FLAGYL) 500 MG tablet Take 1 tablet (500 mg total) by mouth 2 (two) times daily. 08/09/21  Yes Al Decant, PA-C  ?nitrofurantoin, macrocrystal-monohydrate, (MACROBID) 100 MG capsule Take 1 capsule (100 mg total) by mouth 2 (two) times daily. ?Patient taking differently: Take 100 mg by mouth 2 (two) times daily. 5 day course. Pt is on day 3 08/05/21    Peter Garter, PA  ?phenazopyridine (PYRIDIUM) 200 MG tablet Take 1 tablet (200 mg total) by mouth 3 (three) times daily as needed for pain (bladder pain). ?Patient not taking: Reported on 08/09/2021 07/28/21   Valentino Nose, NP  ?   ? ?Allergies    ?Cephalosporins and Penicillins   ? ?Review of Systems   ?Review of Systems  ?Constitutional:  Negative for chills and fever.  ?Gastrointestinal:  Positive for nausea and vomiting. Negative for abdominal pain.  ?Genitourinary:  Positive for dysuria. Negative for pelvic pain, vaginal bleeding, vaginal discharge and vaginal pain.  ?All other systems reviewed and are negative. ? ?Physical Exam ?Updated Vital Signs ?BP (!) 128/93 (BP Location: Right Arm)   Pulse (!) 112   Temp 98.7 ?F (37.1 ?C) (Oral)   Resp 12   Ht 5\' 2"  (1.575 m)   Wt 65.8 kg   LMP 07/10/2021 (Exact Date)   SpO2 100%   BMI 26.52 kg/m?  ?Physical Exam ?Vitals and nursing note reviewed.  ?Constitutional:   ?   General: She is not in acute distress. ?   Appearance: Normal appearance. She is not ill-appearing, toxic-appearing or diaphoretic.  ?HENT:  ?   Head: Normocephalic and atraumatic.  ?   Nose: Nose normal. No congestion.  ?   Mouth/Throat:  ?   Mouth: Mucous membranes are moist.  ?   Pharynx: Oropharynx is clear.  ?Eyes:  ?   Extraocular Movements: Extraocular movements intact.  ?  Conjunctiva/sclera: Conjunctivae normal.  ?   Pupils: Pupils are equal, round, and reactive to light.  ?Cardiovascular:  ?   Rate and Rhythm: Normal rate and regular rhythm.  ?Pulmonary:  ?   Effort: Pulmonary effort is normal.  ?   Breath sounds: Normal breath sounds.  ?Abdominal:  ?   General: Abdomen is flat.  ?   Palpations: Abdomen is soft.  ?   Tenderness: There is no abdominal tenderness. There is no right CVA tenderness or left CVA tenderness.  ?Musculoskeletal:  ?   Cervical back: Normal range of motion and neck supple. No tenderness.  ?Skin: ?   General: Skin is warm and dry.  ?   Capillary Refill:  Capillary refill takes less than 2 seconds.  ?Neurological:  ?   Mental Status: She is alert and oriented to person, place, and time.  ? ? ?ED Results / Procedures / Treatments   ?Labs ?(all labs ordered are listed, but only abnormal results are displayed) ?Labs Reviewed - No data to display ? ?EKG ?None ? ?Radiology ?No results found. ? ?Procedures ?Procedures  ? ? ?Medications Ordered in ED ?Medications  ?ondansetron (ZOFRAN-ODT) disintegrating tablet 8 mg (8 mg Oral Given 08/09/21 1255)  ?gentamicin (GARAMYCIN) injection 240 mg (240 mg Intramuscular Given 08/09/21 1255)  ? ? ?ED Course/ Medical Decision Making/ A&P ?  ?                        ?Medical Decision Making ?Risk ?Prescription drug management. ? ? ?26 year old female presents ED for treatment of gonorrhea and trichomoniasis infection.  Please see HPI for further details. ? ?On examination, the patient is in no apparent distress.  The patient is tachycardic to 112.  Patient nonhypoxic, clear lung sounds bilaterally.  Patient abdomen soft compressible all 4 quadrants.  No CVA tenderness. ? ?Patient deferred pelvic examination.  Just requesting treatment.  Per chart review, patient has positive gonorrhea as well as trichomoniasis infection that was documented on 4/17. ? ?Patient has cephalosporin as well as penicillin allergy.  Patient treated with gentamicin, 240 mg per pharmacy consult.  Patient will be sent home on doxycycline to cover for any co-occurring chlamydial infection.  Patient also sent home metronidazole and counseled to abstain from drinking while on this medication.  ? ?Patient given return precautions and she voiced understanding.  Patient all her questions answered to her satisfaction.  Patient was advised if she begins experiencing pelvic pain, vaginal discharge, fever she is to return to ED for further evaluation.  The patient voiced understanding of this direction.  Patient advised to follow-up with PCP, referred to 1.  Patient stable  for discharge at this time. ? ? ?Final Clinical Impression(s) / ED Diagnoses ?Final diagnoses:  ?Gonorrhea  ?Trichimoniasis  ?STD exposure  ? ? ?Rx / DC Orders ?ED Discharge Orders   ? ?      Ordered  ?  metroNIDAZOLE (FLAGYL) 500 MG tablet  2 times daily       ? 08/09/21 1309  ?  doxycycline (VIBRAMYCIN) 100 MG capsule  2 times daily       ? 08/09/21 1309  ? ?  ?  ? ?  ? ? ?  ?Al Decant, PA-C ?08/09/21 1319 ? ?  ?Virgina Norfolk, DO ?08/09/21 1459 ? ?

## 2021-08-09 NOTE — ED Triage Notes (Signed)
Pt received a phone call stating her gonorrhea test came back positive and to come in for treatment.   ?

## 2021-11-28 ENCOUNTER — Ambulatory Visit
Admission: EM | Admit: 2021-11-28 | Discharge: 2021-11-28 | Disposition: A | Discharge status: Home / Self Care | Payer: Medicaid Other | Attending: Physician Assistant | Admitting: Physician Assistant

## 2021-11-28 DIAGNOSIS — H00012 Hordeolum externum right lower eyelid: Secondary | ICD-10-CM

## 2021-11-28 MED ORDER — POLYMYXIN B-TRIMETHOPRIM 10000-0.1 UNIT/ML-% OP SOLN: 1.0000 [drp] | OPHTHALMIC | 0 refills | Status: AC

## 2021-11-28 NOTE — ED Triage Notes (Signed)
[facility] to [facility] with [facility] of right eye redness and swelling and irritation for 2 days. Pt reports no otc for symptoms and that she has pmh of eye infections

## 2021-11-28 NOTE — ED Provider Notes (Signed)
[name]    CSN: [id number] Arrival date & time: 11/28/21  1224      History   Chief Complaint Chief Complaint  Patient presents with   Conjunctivitis    HPI Donna Mcclain is a 26 y.o. female.   Patient here today for evaluation of irritation to her right eye.  She reports that she noticed some mild discomfort 2 days ago and has since developed some swelling of her left lower lid.  She has not had any other symptoms.  She does have history of eye infection which concerned her.  She denies any injury.  The history is provided by the patient.    Past Medical History:  Diagnosis Date   Constipation    Hypertension    Seasonal allergies     Patient Active Problem List   Diagnosis Date Noted   Left foot infection 03/07/2020   Screening for HIV (human immunodeficiency virus) 03/07/2020   Encounter for hepatitis C screening test for low risk patient 03/07/2020   Migraine without aura and without status migrainosus, not intractable 07/09/2012   Tension headache 07/09/2012   Acute abdominal pain 03/24/2011   ERYTHRASMA 05/03/2010   BREAST PAIN, BILATERAL 05/03/2010   HIDRADENITIS SUPPURATIVA 02/08/2009    No past surgical history on file.  OB History   No obstetric history on file.      Home Medications    Prior to Admission medications   Medication Sig Start Date End Date Taking? Authorizing Provider  trimethoprim-polymyxin b (POLYTRIM) ophthalmic solution Place 1 drop into the right eye every 4 (four) hours for 7 days. 11/28/21 12/05/21 Yes Tomi Bamberger, PA-C  doxycycline (VIBRAMYCIN) 100 MG capsule Take 1 capsule (100 mg total) by mouth 2 (two) times daily. 08/09/21   Al Decant, PA-C  ibuprofen (ADVIL) 200 MG tablet Take 400 mg by mouth every 6 (six) hours as needed for headache or mild pain.    [provider]  metroNIDAZOLE (FLAGYL) 500 MG tablet Take 1 tablet (500 mg total) by mouth 2 (two) times daily. 08/09/21   Al Decant, PA-C  nitrofurantoin, macrocrystal-monohydrate, (MACROBID) 100 MG capsule Take 1 capsule (100 mg total) by mouth 2 (two) times daily. Patient taking differently: Take 100 mg by mouth 2 (two) times daily. 5 day course. Pt is on day 3 08/05/21   Sherian Maroon A, PA  ondansetron (ZOFRAN) 4 MG tablet Take 1 tablet (4 mg total) by mouth every 8 (eight) hours as needed for nausea or vomiting. 08/09/21   Al Decant, PA-C  phenazopyridine (PYRIDIUM) 200 MG tablet Take 1 tablet (200 mg total) by mouth 3 (three) times daily as needed for pain (bladder pain). Patient not taking: Reported on 08/09/2021 07/28/21   Valentino Nose, NP    Family History Family History  Problem Relation Age of Onset   Healthy Mother    Hypertension Father    Migraines Sister     Social History Social History   Tobacco Use   Smoking status: Every Day    Packs/day: 1.00    Years: 8.00    Total pack years: 8.00    Types: Cigarettes   Smokeless tobacco: Never   Tobacco comments:    smokes inside w/ children  Vaping Use   Vaping Use: Never used  Substance Use Topics   Alcohol use: Yes    Comment: Socially   Drug use: Yes    Frequency: 5.0 times per week  Types: Marijuana     Allergies   Cephalosporins and Penicillins   Review of Systems Review of Systems  Constitutional:  Negative for chills and fever.  Eyes:  Negative for discharge, redness and visual disturbance.  Gastrointestinal:  Negative for abdominal pain, nausea and vomiting.     Physical Exam Triage Vital Signs ED Triage Vitals  Enc Vitals Group     BP      Pulse      Resp      Temp      Temp src      SpO2      Weight      Height      Head Circumference      Peak Flow      Pain Score      Pain Loc      Pain Edu?      Excl. in GC?    No data found.  Updated Vital Signs BP 117/66 (BP Location: Right Arm)   Pulse 75   Temp 98.1 F (36.7 C)   Resp 18   SpO2 98%   Physical Exam Vitals and  nursing note reviewed.  Constitutional:      General: She is not in acute distress.    Appearance: Normal appearance. She is not ill-appearing.  HENT:     Head: Normocephalic and atraumatic.  Eyes:     Extraocular Movements: Extraocular movements intact.     Conjunctiva/sclera: Conjunctivae normal.     Pupils: Pupils are equal, round, and reactive to light.     Comments: Mild swelling to the right lower lid  Cardiovascular:     Rate and Rhythm: Normal rate.  Pulmonary:     Effort: Pulmonary effort is normal.  Neurological:     Mental Status: She is alert.  Psychiatric:        Mood and Affect: Mood normal.        Behavior: Behavior normal.        Thought Content: Thought content normal.      UC Treatments / Results  Labs (all labs ordered are listed, but only abnormal results are displayed) Labs Reviewed - No data to display  EKG   Radiology No results found.  Procedures Procedures (including critical care time)  Medications Ordered in UC Medications - No data to display  Initial Impression / Assessment and Plan / UC Course  I have reviewed the triage vital signs and the nursing notes.  Pertinent labs & imaging results that were available during my care of the patient were reviewed by me and considered in my medical decision making (see chart for details).    Recommended alternating warm and cool compresses and antibiotic drops prescribed.  Recommended follow-up with ophthalmologist if symptoms do not improve over the weekend or sooner with any worsening given history.  Final Clinical Impressions(s) / UC Diagnoses   Final diagnoses:  Hordeolum externum of right lower eyelid   Discharge Instructions   None    ED Prescriptions     Medication Sig Dispense Auth. Provider   trimethoprim-polymyxin b (POLYTRIM) ophthalmic solution Place 1 drop into the right eye every 4 (four) hours for 7 days. 10 mL Tomi Bamberger, PA-C      PDMP not reviewed this  encounter.   Tomi Bamberger, PA-C 11/28/21 1427

## 2021-12-16 ENCOUNTER — Ambulatory Visit
Admission: EM | Admit: 2021-12-16 | Discharge: 2021-12-16 | Disposition: A | Discharge status: Home / Self Care | Payer: Medicaid Other | Attending: Physician Assistant | Admitting: Physician Assistant

## 2021-12-16 DIAGNOSIS — Z20822 Contact with and (suspected) exposure to covid-19: Secondary | ICD-10-CM | POA: Insufficient documentation

## 2021-12-16 DIAGNOSIS — J069 Acute upper respiratory infection, unspecified: Secondary | ICD-10-CM | POA: Insufficient documentation

## 2021-12-16 NOTE — ED Provider Notes (Signed)
[name]    CSN: [id number] Arrival date & time: 12/16/21  0807      History   Chief Complaint Chief Complaint  Patient presents with   Cough    HPI Donna Mcclain is a 26 y.o. female.   Patient here today for evaluation of nasal congestion, cough that started about 3 days ago.  She reports that she is concerned she has had exposure to COVID as she works at the [employer].  She has not had known fever but does report some chills.  She has had some diarrhea but denies any nausea or vomiting.  She does not report any treatment for symptoms.  The history is provided by the patient.  Cough Associated symptoms: chills and sore throat   Associated symptoms: no ear pain, no eye discharge, no fever, no shortness of breath and no wheezing     Past Medical History:  Diagnosis Date   Constipation    Hypertension    Seasonal allergies     Patient Active Problem List   Diagnosis Date Noted   Left foot infection 03/07/2020   Screening for HIV (human immunodeficiency virus) 03/07/2020   Encounter for hepatitis C screening test for low risk patient 03/07/2020   Migraine without aura and without status migrainosus, not intractable 07/09/2012   Tension headache 07/09/2012   Acute abdominal pain 03/24/2011   ERYTHRASMA 05/03/2010   BREAST PAIN, BILATERAL 05/03/2010   HIDRADENITIS SUPPURATIVA 02/08/2009    History reviewed. No pertinent surgical history.  OB History   No obstetric history on file.      Home Medications    Prior to Admission medications   Medication Sig Start Date End Date Taking? Authorizing Provider  doxycycline (VIBRAMYCIN) 100 MG capsule Take 1 capsule (100 mg total) by mouth 2 (two) times daily. 08/09/21   Al Decant, PA-C  ibuprofen (ADVIL) 200 MG tablet Take 400 mg by mouth every 6 (six) hours as needed for headache or mild pain.    [provider]  metroNIDAZOLE (FLAGYL) 500 MG tablet Take 1 tablet (500 mg  total) by mouth 2 (two) times daily. 08/09/21   Al Decant, PA-C  nitrofurantoin, macrocrystal-monohydrate, (MACROBID) 100 MG capsule Take 1 capsule (100 mg total) by mouth 2 (two) times daily. Patient taking differently: Take 100 mg by mouth 2 (two) times daily. 5 day course. Pt is on day 3 08/05/21   Sherian Maroon A, PA  ondansetron (ZOFRAN) 4 MG tablet Take 1 tablet (4 mg total) by mouth every 8 (eight) hours as needed for nausea or vomiting. 08/09/21   Al Decant, PA-C  phenazopyridine (PYRIDIUM) 200 MG tablet Take 1 tablet (200 mg total) by mouth 3 (three) times daily as needed for pain (bladder pain). Patient not taking: Reported on 08/09/2021 07/28/21   Valentino Nose, NP    Family History Family History  Problem Relation Age of Onset   Healthy Mother    Hypertension Father    Migraines Sister     Social History Social History   Tobacco Use   Smoking status: Every Day    Packs/day: 1.00    Years: 8.00    Total pack years: 8.00    Types: Cigarettes   Smokeless tobacco: Never   Tobacco comments:    smokes inside w/ children  Vaping Use   Vaping Use: Never used  Substance Use Topics   Alcohol use: Yes    Comment: Socially   Drug use:  Yes    Frequency: 5.0 times per week    Types: Marijuana     Allergies   Cephalosporins and Penicillins   Review of Systems Review of Systems  Constitutional:  Positive for chills. Negative for fever.  HENT:  Positive for congestion, sinus pressure and sore throat. Negative for ear pain.   Eyes:  Negative for discharge and redness.  Respiratory:  Positive for cough. Negative for shortness of breath and wheezing.   Gastrointestinal:  Positive for diarrhea. Negative for abdominal pain, nausea and vomiting.     Physical Exam Triage Vital Signs ED Triage Vitals [12/16/21 0826]  Enc Vitals Group     BP 127/77     Pulse Rate 90     Resp 17     Temp 98.9 F (37.2 C)     Temp Source Oral     SpO2 96 %      Weight      Height      Head Circumference      Peak Flow      Pain Score 0     Pain Loc      Pain Edu?      Excl. in GC?    No data found.  Updated Vital Signs BP 127/77 (BP Location: Left Arm)   Pulse 90   Temp 98.9 F (37.2 C) (Oral)   Resp 17   SpO2 96%      Physical Exam Vitals and nursing note reviewed.  Constitutional:      General: She is not in acute distress.    Appearance: Normal appearance. She is not ill-appearing.  HENT:     Head: Normocephalic and atraumatic.     Nose: Congestion present.     Mouth/Throat:     Mouth: Mucous membranes are moist.     Pharynx: No oropharyngeal exudate or posterior oropharyngeal erythema.  Eyes:     Conjunctiva/sclera: Conjunctivae normal.  Cardiovascular:     Rate and Rhythm: Normal rate and regular rhythm.     Heart sounds: Normal heart sounds. No murmur heard. Pulmonary:     Effort: Pulmonary effort is normal. No respiratory distress.     Breath sounds: Normal breath sounds. No wheezing, rhonchi or rales.  Skin:    General: Skin is warm and dry.  Neurological:     Mental Status: She is alert.  Psychiatric:        Mood and Affect: Mood normal.        Thought Content: Thought content normal.      UC Treatments / Results  Labs (all labs ordered are listed, but only abnormal results are displayed) Labs Reviewed  SARS CORONAVIRUS 2 (TAT 6-24 HRS)    EKG   Radiology No results found.  Procedures Procedures (including critical care time)  Medications Ordered in UC Medications - No data to display  Initial Impression / Assessment and Plan / UC Course  I have reviewed the triage vital signs and the nursing notes.  Pertinent labs & imaging results that were available during my care of the patient were reviewed by me and considered in my medical decision making (see chart for details).    Suspect viral etiology of symptoms.  Will order COVID screening and will await results further recommendation.   Encouraged symptomatic treatment and increase fluids and rest.  Recommend follow-up with any further concerns.   Final Clinical Impressions(s) / UC Diagnoses   Final diagnoses:  Acute upper respiratory infection   Discharge Instructions  None    ED Prescriptions   None    PDMP not reviewed this encounter.   Tomi Bamberger, PA-C 12/16/21 [phone]

## 2021-12-16 NOTE — ED Triage Notes (Signed)
Pt c/o cough since ~ Saturday. Associated sneezing, nasal drainage, headache. Concerned for covid exposure through work. Denies pain.

## 2021-12-17 LAB — SARS CORONAVIRUS 2 (TAT 6-24 HRS): SARS Coronavirus 2: NEGATIVE

## 2022-01-28 ENCOUNTER — Ambulatory Visit
Admission: EM | Admit: 2022-01-28 | Discharge: 2022-01-28 | Disposition: A | Discharge status: Home / Self Care | Payer: Medicaid Other | Attending: Physician Assistant | Admitting: Physician Assistant

## 2022-01-28 DIAGNOSIS — M7989 Other specified soft tissue disorders: Secondary | ICD-10-CM

## 2022-01-28 MED ORDER — PREDNISONE 20 MG PO TABS: 40.0000 mg | ORAL_TABLET | Freq: Every day | ORAL | 0 refills | Status: AC

## 2022-01-28 NOTE — ED Provider Notes (Signed)
[name]    CSN: [id number] Arrival date & time: 01/28/22  0807      History   Chief Complaint Chief Complaint  Patient presents with   Hand Pain    HPI Donna Mcclain is a 26 y.o. female.   Patient here today for evaluation of swelling to her PIP joint of her middle finger.  She states she noted this yesterday.  She has not had any injury to her finger that she is aware of.  There is pain associated with swelling.  Movement worsens pain.  She does not report fever.  The history is provided by the patient.  Hand Pain    Past Medical History:  Diagnosis Date   Constipation    Hypertension    Seasonal allergies     Patient Active Problem List   Diagnosis Date Noted   Left foot infection 03/07/2020   Screening for HIV (human immunodeficiency virus) 03/07/2020   Encounter for hepatitis C screening test for low risk patient 03/07/2020   Migraine without aura and without status migrainosus, not intractable 07/09/2012   Tension headache 07/09/2012   Acute abdominal pain 03/24/2011   ERYTHRASMA 05/03/2010   BREAST PAIN, BILATERAL 05/03/2010   HIDRADENITIS SUPPURATIVA 02/08/2009    History reviewed. No pertinent surgical history.  OB History   No obstetric history on file.      Home Medications    Prior to Admission medications   Medication Sig Start Date End Date Taking? Authorizing Provider  predniSONE (DELTASONE) 20 MG tablet Take 2 tablets (40 mg total) by mouth daily with breakfast for 5 days. 01/28/22 02/02/22 Yes Tomi Bamberger, PA-C  doxycycline (VIBRAMYCIN) 100 MG capsule Take 1 capsule (100 mg total) by mouth 2 (two) times daily. 08/09/21   Al Decant, PA-C  ibuprofen (ADVIL) 200 MG tablet Take 400 mg by mouth every 6 (six) hours as needed for headache or mild pain.    [provider]  metroNIDAZOLE (FLAGYL) 500 MG tablet Take 1 tablet (500 mg total) by mouth 2 (two) times daily. 08/09/21   Al Decant,  PA-C  nitrofurantoin, macrocrystal-monohydrate, (MACROBID) 100 MG capsule Take 1 capsule (100 mg total) by mouth 2 (two) times daily. Patient taking differently: Take 100 mg by mouth 2 (two) times daily. 5 day course. Pt is on day 3 08/05/21   Sherian Maroon A, PA  ondansetron (ZOFRAN) 4 MG tablet Take 1 tablet (4 mg total) by mouth every 8 (eight) hours as needed for nausea or vomiting. 08/09/21   Al Decant, PA-C  phenazopyridine (PYRIDIUM) 200 MG tablet Take 1 tablet (200 mg total) by mouth 3 (three) times daily as needed for pain (bladder pain). Patient not taking: Reported on 08/09/2021 07/28/21   Valentino Nose, NP    Family History Family History  Problem Relation Age of Onset   Healthy Mother    Hypertension Father    Migraines Sister     Social History Social History   Tobacco Use   Smoking status: Every Day    Packs/day: 1.00    Years: 8.00    Total pack years: 8.00    Types: Cigarettes   Smokeless tobacco: Never   Tobacco comments:    smokes inside w/ children  Vaping Use   Vaping Use: Never used  Substance Use Topics   Alcohol use: Yes    Comment: Socially   Drug use: Yes    Frequency: 5.0 times per week  Types: Marijuana     Allergies   Cephalosporins and Penicillins   Review of Systems Review of Systems  Constitutional:  Negative for chills and fever.  Eyes:  Negative for discharge and redness.  Gastrointestinal:  Negative for nausea and vomiting.  Musculoskeletal:  Positive for arthralgias and joint swelling.  Skin:  Negative for color change and wound.  Neurological:  Negative for numbness.     Physical Exam Triage Vital Signs ED Triage Vitals  Enc Vitals Group     BP 01/28/22 0818 114/78     Pulse Rate 01/28/22 0817 90     Resp 01/28/22 0817 18     Temp 01/28/22 0817 98.1 F (36.7 C)     Temp Source 01/28/22 0817 Oral     SpO2 01/28/22 0817 97 %     Weight --      Height --      Head Circumference --      Peak Flow --       Pain Score 01/28/22 0816 8     Pain Loc --      Pain Edu? --      Excl. in [place]? --    No data found.  Updated Vital Signs BP 114/78   Pulse 90   Temp 98.1 F (36.7 C) (Oral)   Resp 18   LMP 01/25/2022   SpO2 97%      Physical Exam Vitals and nursing note reviewed.  Constitutional:      General: She is not in acute distress.    Appearance: Normal appearance. She is not ill-appearing.  HENT:     Head: Normocephalic and atraumatic.  Eyes:     Conjunctiva/sclera: Conjunctivae normal.  Cardiovascular:     Rate and Rhythm: Normal rate.  Pulmonary:     Effort: Pulmonary effort is normal. No respiratory distress.  Musculoskeletal:     Comments: Mild swelling of right middle PIP joint with associated tenderness to palpation.  Decreased range of motion of right third PIP.  Skin:    Capillary Refill: Normal cap refill to distal right middle finger Neurological:     Mental Status: She is alert.     Comments: Gross sensation intact to distal right middle finger  Psychiatric:        Mood and Affect: Mood normal.        Behavior: Behavior normal.        Thought Content: Thought content normal.      UC Treatments / Results  Labs (all labs ordered are listed, but only abnormal results are displayed) Labs Reviewed - No data to display  EKG   Radiology No results found.  Procedures Procedures (including critical care time)  Medications Ordered in UC Medications - No data to display  Initial Impression / Assessment and Plan / UC Course  I have reviewed the triage vital signs and the nursing notes.  Pertinent labs & imaging results that were available during my care of the patient were reviewed by me and considered in my medical decision making (see chart for details).    Unclear etiology of swelling, will treat with steroids to decrease inflammation.  Splint provided in office for increased comfort.  Encouraged follow-up if no gradual improvement or with any  further concerns.  Final Clinical Impressions(s) / UC Diagnoses   Final diagnoses:  Swelling of right middle finger   Discharge Instructions   None    ED Prescriptions     Medication Sig Dispense Auth. Provider  predniSONE (DELTASONE) 20 MG tablet Take 2 tablets (40 mg total) by mouth daily with breakfast for 5 days. 10 tablet Tomi Bamberger, PA-C      PDMP not reviewed this encounter.   Tomi Bamberger, PA-C 01/28/22 [phone]

## 2022-01-28 NOTE — ED Triage Notes (Signed)
Pt presents with right middle finger pain with no known injury.

## 2022-04-03 ENCOUNTER — Ambulatory Visit
Admission: EM | Admit: 2022-04-03 | Discharge: 2022-04-03 | Disposition: A | Discharge status: Home / Self Care | Payer: Commercial Managed Care - HMO | Attending: Physician Assistant | Admitting: Physician Assistant

## 2022-04-03 DIAGNOSIS — J069 Acute upper respiratory infection, unspecified: Secondary | ICD-10-CM | POA: Diagnosis not present

## 2022-04-03 DIAGNOSIS — Z1152 Encounter for screening for COVID-19: Secondary | ICD-10-CM | POA: Insufficient documentation

## 2022-04-03 LAB — RESP PANEL BY RT-PCR (FLU A&B, COVID) ARPGX2
Influenza A by PCR: NEGATIVE
Influenza B by PCR: NEGATIVE
SARS Coronavirus 2 by RT PCR: NEGATIVE

## 2022-04-03 NOTE — ED Triage Notes (Signed)
Pt presents with cough, nasal drainage, and bilateral ear pain X 4 days.

## 2022-04-03 NOTE — ED Provider Notes (Signed)
[name]    CSN: [id number] Arrival date & time: 04/03/22  1444      History   Chief Complaint Chief Complaint  Patient presents with   Otalgia   Cough    HPI Donna Mcclain is a 26 y.o. female.   Patient here today for evaluation of cough, congestion, nasal drainage and bilateral ear pain that is been present for 4 days.  She has tried OTC meds without resolution. She does not report vomiting or diarrhea.   The history is provided by the patient.  Otalgia Associated symptoms: congestion, cough and sore throat   Associated symptoms: no abdominal pain, no diarrhea, no fever and no vomiting   Cough Associated symptoms: ear pain and sore throat   Associated symptoms: no chills, no eye discharge, no fever, no shortness of breath and no wheezing     Past Medical History:  Diagnosis Date   Constipation    Hypertension    Seasonal allergies     Patient Active Problem List   Diagnosis Date Noted   Left foot infection 03/07/2020   Screening for HIV (human immunodeficiency virus) 03/07/2020   Encounter for hepatitis C screening test for low risk patient 03/07/2020   Migraine without aura and without status migrainosus, not intractable 07/09/2012   Tension headache 07/09/2012   Acute abdominal pain 03/24/2011   ERYTHRASMA 05/03/2010   BREAST PAIN, BILATERAL 05/03/2010   HIDRADENITIS SUPPURATIVA 02/08/2009    History reviewed. No pertinent surgical history.  OB History   No obstetric history on file.      Home Medications    Prior to Admission medications   Medication Sig Start Date End Date Taking? Authorizing Provider  doxycycline (VIBRAMYCIN) 100 MG capsule Take 1 capsule (100 mg total) by mouth 2 (two) times daily. 08/09/21   Al Decant, PA-C  ibuprofen (ADVIL) 200 MG tablet Take 400 mg by mouth every 6 (six) hours as needed for headache or mild pain.    [provider]  metroNIDAZOLE (FLAGYL) 500 MG tablet Take 1 tablet  (500 mg total) by mouth 2 (two) times daily. 08/09/21   Al Decant, PA-C  nitrofurantoin, macrocrystal-monohydrate, (MACROBID) 100 MG capsule Take 1 capsule (100 mg total) by mouth 2 (two) times daily. Patient taking differently: Take 100 mg by mouth 2 (two) times daily. 5 day course. Pt is on day 3 08/05/21   Sherian Maroon A, PA  ondansetron (ZOFRAN) 4 MG tablet Take 1 tablet (4 mg total) by mouth every 8 (eight) hours as needed for nausea or vomiting. 08/09/21   Al Decant, PA-C  phenazopyridine (PYRIDIUM) 200 MG tablet Take 1 tablet (200 mg total) by mouth 3 (three) times daily as needed for pain (bladder pain). Patient not taking: Reported on 08/09/2021 07/28/21   Valentino Nose, NP    Family History Family History  Problem Relation Age of Onset   Healthy Mother    Hypertension Father    Migraines Sister     Social History Social History   Tobacco Use   Smoking status: Every Day    Packs/day: 1.00    Years: 8.00    Total pack years: 8.00    Types: Cigarettes   Smokeless tobacco: Never   Tobacco comments:    smokes inside w/ children  Vaping Use   Vaping Use: Never used  Substance Use Topics   Alcohol use: Yes    Comment: Socially   Drug use: Yes    Frequency:  5.0 times per week    Types: Marijuana     Allergies   Cephalosporins and Penicillins   Review of Systems Review of Systems  Constitutional:  Negative for chills and fever.  HENT:  Positive for congestion, ear pain and sore throat.   Eyes:  Negative for discharge and redness.  Respiratory:  Positive for cough. Negative for shortness of breath and wheezing.   Gastrointestinal:  Negative for abdominal pain, diarrhea, nausea and vomiting.     Physical Exam Triage Vital Signs ED Triage Vitals  Enc Vitals Group     BP 04/03/22 1616 125/78     Pulse Rate 04/03/22 1616 76     Resp 04/03/22 1616 17     Temp 04/03/22 1616 98 F (36.7 C)     Temp Source 04/03/22 1616 Oral     SpO2  04/03/22 1616 98 %     Weight --      Height --      Head Circumference --      Peak Flow --      Pain Score 04/03/22 1615 7     Pain Loc --      Pain Edu? --      Excl. in [place]? --    No data found.  Updated Vital Signs BP 125/78 (BP Location: Left Arm)   Pulse 76   Temp 98 F (36.7 C) (Oral)   Resp 17   LMP 03/30/2022   SpO2 98%     Physical Exam Vitals and nursing note reviewed.  Constitutional:      General: She is not in acute distress.    Appearance: Normal appearance. She is not ill-appearing.  HENT:     Head: Normocephalic and atraumatic.     Ears:     Comments: Bilateral Tms dull    Nose: Congestion present.     Mouth/Throat:     Mouth: Mucous membranes are moist.     Pharynx: No oropharyngeal exudate or posterior oropharyngeal erythema.  Eyes:     Conjunctiva/sclera: Conjunctivae normal.  Cardiovascular:     Rate and Rhythm: Normal rate and regular rhythm.     Heart sounds: Normal heart sounds. No murmur heard. Pulmonary:     Effort: Pulmonary effort is normal. No respiratory distress.     Breath sounds: Normal breath sounds. No wheezing, rhonchi or rales.  Skin:    General: Skin is warm and dry.  Neurological:     Mental Status: She is alert.  Psychiatric:        Mood and Affect: Mood normal.        Thought Content: Thought content normal.      UC Treatments / Results  Labs (all labs ordered are listed, but only abnormal results are displayed) Labs Reviewed  RESP PANEL BY RT-PCR (FLU A&B, COVID) [id number]    EKG   Radiology No results found.  Procedures Procedures (including critical care time)  Medications Ordered in UC Medications - No data to display  Initial Impression / Assessment and Plan / UC Course  I have reviewed the triage vital signs and the nursing notes.  Pertinent labs & imaging results that were available during my care of the patient were reviewed by me and considered in my medical decision making (see chart for  details).    Suspect likely viral etiology of symptoms.  Will order COVID and flu screening.  Recommended follow-up if no gradual improvement or with any further concerns.  Encouraged symptomatic treatment  while awaiting results.  Final Clinical Impressions(s) / UC Diagnoses   Final diagnoses:  Viral upper respiratory tract infection  Encounter for screening for COVID-19   Discharge Instructions   None    ED Prescriptions   None    PDMP not reviewed this encounter.   Francene Finders, PA-C 04/03/22 1839

## 2023-02-22 ENCOUNTER — Encounter: Payer: Self-pay | Admitting: *Deleted

## 2023-02-22 ENCOUNTER — Other Ambulatory Visit: Payer: Self-pay

## 2023-02-22 ENCOUNTER — Ambulatory Visit
Admission: EM | Admit: 2023-02-22 | Discharge: 2023-02-22 | Disposition: A | Discharge status: Home / Self Care | Payer: Managed Care, Other (non HMO) | Attending: Internal Medicine | Admitting: Internal Medicine

## 2023-02-22 DIAGNOSIS — B353 Tinea pedis: Secondary | ICD-10-CM | POA: Diagnosis not present

## 2023-02-22 MED ORDER — CLOTRIMAZOLE 1 % EX CREA
TOPICAL_CREAM | CUTANEOUS | 0 refills | Status: DC
Start: 2023-02-22 — End: 2024-01-20

## 2023-02-22 NOTE — ED Provider Notes (Signed)
[name]    CSN: [id number] Arrival date & time: 02/22/23  1504      History   Chief Complaint Chief Complaint  Patient presents with   Wound Check    HPI CITLALIC NORLANDER is a 27 y.o. female.   Patient presents with rash in between third and fourth toes and fourth and fifth toes that has been present for about a month.  Reports this is the first time she has been seen since it started.  Reports it is mildly irritating.  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   Wound Check    Past Medical History:  Diagnosis Date   Constipation    Hypertension    Seasonal allergies     Patient Active Problem List   Diagnosis Date Noted   Left foot infection 03/07/2020   Screening for HIV (human immunodeficiency virus) 03/07/2020   Encounter for hepatitis C screening test for low risk patient 03/07/2020   Migraine without aura and without status migrainosus, not intractable 07/09/2012   Tension headache 07/09/2012   Acute abdominal pain 03/24/2011   ERYTHRASMA 05/03/2010   BREAST PAIN, BILATERAL 05/03/2010   HIDRADENITIS SUPPURATIVA 02/08/2009    History reviewed. No pertinent surgical history.  OB History   No obstetric history on file.      Home Medications    Prior to Admission medications   Medication Sig Start Date End Date Taking? Authorizing Provider  clotrimazole (LOTRIMIN) 1 % cream Apply to affected area 2 times daily 02/22/23  Yes [place], [place], [place]  doxycycline (VIBRAMYCIN) 100 MG capsule Take 1 capsule (100 mg total) by mouth 2 (two) times daily. 08/09/21   Al Decant, PA-C  ibuprofen (ADVIL) 200 MG tablet Take 400 mg by mouth every 6 (six) hours as needed for headache or mild pain.    [provider]  metroNIDAZOLE (FLAGYL) 500 MG tablet Take 1 tablet (500 mg total) by mouth 2 (two) times daily. 08/09/21   Al Decant, PA-C  nitrofurantoin, macrocrystal-monohydrate, (MACROBID) 100 MG  capsule Take 1 capsule (100 mg total) by mouth 2 (two) times daily. Patient taking differently: Take 100 mg by mouth 2 (two) times daily. 5 day course. Pt is on day 3 08/05/21   Sherian Maroon A, PA  ondansetron (ZOFRAN) 4 MG tablet Take 1 tablet (4 mg total) by mouth every 8 (eight) hours as needed for nausea or vomiting. 08/09/21   Al Decant, PA-C  phenazopyridine (PYRIDIUM) 200 MG tablet Take 1 tablet (200 mg total) by mouth 3 (three) times daily as needed for pain (bladder pain). Patient not taking: Reported on 08/09/2021 07/28/21   Valentino Nose, NP    Family History Family History  Problem Relation Age of Onset   Healthy Mother    Hypertension Father    Migraines Sister     Social History Social History   Tobacco Use   Smoking status: Every Day    Current packs/day: 1.00    Average packs/day: 1 pack/day for 8.0 years (8.0 ttl pk-yrs)    Types: Cigarettes   Smokeless tobacco: Never   Tobacco comments:    smokes inside w/ children  Vaping Use   Vaping status: Every Day  Substance Use Topics   Alcohol use: Yes    Comment: Socially   Drug use: Yes    Frequency: 5.0 times per week    Types: Marijuana     Allergies  Cephalosporins and Penicillins   Review of Systems Review of Systems Per HPI  Physical Exam Triage Vital Signs ED Triage Vitals  Encounter Vitals Group     BP 02/22/23 1526 109/74     Systolic BP Percentile --      Diastolic BP Percentile --      Pulse Rate 02/22/23 1526 82     Resp 02/22/23 1526 16     Temp 02/22/23 1526 98.1 F (36.7 C)     Temp Source 02/22/23 1526 Oral     SpO2 02/22/23 1526 97 %     Weight --      Height --      Head Circumference --      Peak Flow --      Pain Score 02/22/23 1523 7     Pain Loc --      Pain Education --      Exclude from Growth Chart --    No data found.  Updated Vital Signs BP 109/74 (BP Location: Left Arm)   Pulse 82   Temp 98.1 F (36.7 C) (Oral)   Resp 16   LMP 01/20/2023    SpO2 97%   Visual Acuity Right Eye Distance:   Left Eye Distance:   Bilateral Distance:    Right Eye Near:   Left Eye Near:    Bilateral Near:     Physical Exam Constitutional:      General: She is not in acute distress.    Appearance: Normal appearance. She is not toxic-appearing or diaphoretic.  HENT:     Head: Normocephalic and atraumatic.  Eyes:     Extraocular Movements: Extraocular movements intact.     Conjunctiva/sclera: Conjunctivae normal.  Pulmonary:     Effort: Pulmonary effort is normal.  Feet:     Comments: Patient has white-colored rash in between fourth and fifth toes and third and fourth toes of right foot.  No significant swelling or extension of rash to the foot.  Toenails appear normal. Neurological:     General: No focal deficit present.     Mental Status: She is alert and oriented to person, place, and time. Mental status is at baseline.  Psychiatric:        Mood and Affect: Mood normal.        Behavior: Behavior normal.        Thought Content: Thought content normal.        Judgment: Judgment normal.      UC Treatments / Results  Labs (all labs ordered are listed, but only abnormal results are displayed) Labs Reviewed - No data to display  EKG   Radiology No results found.  Procedures Procedures (including critical care time)  Medications Ordered in UC Medications - No data to display  Initial Impression / Assessment and Plan / UC Course  I have reviewed the triage vital signs and the nursing notes.  Pertinent labs & imaging results that were available during my care of the patient were reviewed by me and considered in my medical decision making (see chart for details).     Rash is consistent with tinea pedis.  Rash appears to be causing lots of inflammation in between the fourth and fifth toes so I do think seeing podiatry is the best next step for patient as she may require oral antifungal treatment.  Will treat with topical  clotrimazole.  Advised patient of supportive care including wearing white socks and changing socks frequently.  Referral to podiatry  was placed.  Advised patient if they do not call her in the next few days, she is to call them herself at provided contact information.  Patient verbalized understanding and was agreeable with plan. Final Clinical Impressions(s) / UC Diagnoses   Final diagnoses:  Tinea pedis of right foot     Discharge Instructions      I have prescribed you a topical cream to apply in between toes.  Follow-up with podiatrist.  Wear white socks and change them frequently.    ED Prescriptions     Medication Sig Dispense Auth. Provider   clotrimazole (LOTRIMIN) 1 % cream Apply to affected area 2 times daily 15 g [place], [place]      PDMP not reviewed this encounter.   Gustavus Bryant, [place] 02/22/23 [phone]

## 2023-02-22 NOTE — ED Triage Notes (Signed)
Pt c/o infection between right 4th and 5th toes x 1 month. She has not tried any medications yet

## 2023-02-22 NOTE — Discharge Instructions (Signed)
I have prescribed you a topical cream to apply in between toes.  Follow-up with podiatrist.  Wear white socks and change them frequently.

## 2023-02-25 ENCOUNTER — Ambulatory Visit
Admission: EM | Admit: 2023-02-25 | Discharge: 2023-02-25 | Disposition: A | Discharge status: Home / Self Care | Payer: Managed Care, Other (non HMO) | Attending: Physician Assistant | Admitting: Physician Assistant

## 2023-02-25 ENCOUNTER — Emergency Department (HOSPITAL_COMMUNITY)
Admission: EM | Admit: 2023-02-25 | Discharge: 2023-02-26 | Disposition: A | Discharge status: Home / Self Care | Payer: Commercial Managed Care - HMO | Attending: Emergency Medicine | Admitting: Emergency Medicine

## 2023-02-25 ENCOUNTER — Encounter (HOSPITAL_COMMUNITY): Payer: Self-pay

## 2023-02-25 ENCOUNTER — Emergency Department (HOSPITAL_COMMUNITY): Payer: Commercial Managed Care - HMO

## 2023-02-25 ENCOUNTER — Other Ambulatory Visit: Payer: Self-pay

## 2023-02-25 DIAGNOSIS — R110 Nausea: Secondary | ICD-10-CM | POA: Diagnosis not present

## 2023-02-25 DIAGNOSIS — R1031 Right lower quadrant pain: Secondary | ICD-10-CM

## 2023-02-25 DIAGNOSIS — R8290 Unspecified abnormal findings in urine: Secondary | ICD-10-CM | POA: Diagnosis not present

## 2023-02-25 DIAGNOSIS — R1011 Right upper quadrant pain: Secondary | ICD-10-CM | POA: Diagnosis not present

## 2023-02-25 DIAGNOSIS — R6883 Chills (without fever): Secondary | ICD-10-CM | POA: Diagnosis not present

## 2023-02-25 LAB — CBC
HCT: 37.6 % (ref 36.0–46.0)
Hemoglobin: 12.1 g/dL (ref 12.0–15.0)
MCH: 29.2 pg (ref 26.0–34.0)
MCHC: 32.2 g/dL (ref 30.0–36.0)
MCV: 90.8 fL (ref 80.0–100.0)
Platelets: 368 10*3/uL (ref 150–400)
RBC: 4.14 MIL/uL (ref 3.87–5.11)
RDW: 11.8 % (ref 11.5–15.5)
WBC: 7.5 10*3/uL (ref 4.0–10.5)
nRBC: 0 % (ref 0.0–0.2)

## 2023-02-25 LAB — COMPREHENSIVE METABOLIC PANEL
ALT: 14 U/L (ref 0–44)
AST: 20 U/L (ref 15–41)
Albumin: 4.6 g/dL (ref 3.5–5.0)
Alkaline Phosphatase: 68 U/L (ref 38–126)
Anion gap: 5 (ref 5–15)
BUN: 10 mg/dL (ref 6–20)
CO2: 24 mmol/L (ref 22–32)
Calcium: 9 mg/dL (ref 8.9–10.3)
Chloride: 106 mmol/L (ref 98–111)
Creatinine, Ser: 0.64 mg/dL (ref 0.44–1.00)
GFR, Estimated: >60 mL/min (ref >60)
Glucose, Bld: 116 mg/dL — ABNORMAL HIGH (ref 70–99)
Potassium: 3.5 mmol/L (ref 3.5–5.1)
Sodium: 135 mmol/L (ref 135–145)
Total Bilirubin: 0.7 mg/dL (ref <1.2)
Total Protein: 8.3 g/dL — ABNORMAL HIGH (ref 6.5–8.1)

## 2023-02-25 LAB — URINALYSIS, ROUTINE W REFLEX MICROSCOPIC
Bilirubin Urine: NEGATIVE
Glucose, UA: NEGATIVE mg/dL
Hgb urine dipstick: NEGATIVE
Ketones, ur: NEGATIVE mg/dL
Leukocytes,Ua: NEGATIVE
Nitrite: NEGATIVE
Protein, ur: NEGATIVE mg/dL
Specific Gravity, Urine: 1.004 — ABNORMAL LOW (ref 1.005–1.030)
pH: 7 (ref 5.0–8.0)

## 2023-02-25 LAB — POCT URINALYSIS DIP (MANUAL ENTRY)
Blood, UA: NEGATIVE
Glucose, UA: NEGATIVE mg/dL
Leukocytes, UA: NEGATIVE
Nitrite, UA: NEGATIVE
Protein Ur, POC: 30 mg/dL — AB
Spec Grav, UA: 1.025 (ref 1.010–1.025)
Urobilinogen, UA: >=8 U/dL — AB
pH, UA: 7 (ref 5.0–8.0)

## 2023-02-25 LAB — HCG, SERUM, QUALITATIVE: Preg, Serum: NEGATIVE

## 2023-02-25 LAB — LIPASE, BLOOD: Lipase: 26 U/L (ref 11–51)

## 2023-02-25 LAB — POCT URINE PREGNANCY: Preg Test, Ur: NEGATIVE

## 2023-02-25 MED ORDER — PROCHLORPERAZINE EDISYLATE 10 MG/2ML IJ SOLN
10.0000 mg | Freq: Once | INTRAMUSCULAR | Status: AC
  Administered 2023-02-25: 10 mg via INTRAVENOUS
  Filled 2023-02-25: qty 2

## 2023-02-25 MED ORDER — ONDANSETRON 4 MG PO TBDP
4.0000 mg | ORAL_TABLET | Freq: Once | ORAL | Status: AC | PRN
  Administered 2023-02-25: 4 mg via ORAL
  Filled 2023-02-25: qty 1

## 2023-02-25 MED ORDER — SODIUM CHLORIDE 0.9 % IV BOLUS
1000.0000 mL | Freq: Once | INTRAVENOUS | Status: AC
  Administered 2023-02-25: 1000 mL via INTRAVENOUS

## 2023-02-25 MED ORDER — DIPHENHYDRAMINE HCL 50 MG/ML IJ SOLN
50.0000 mg | Freq: Once | INTRAMUSCULAR | Status: AC
  Administered 2023-02-25: 50 mg via INTRAVENOUS
  Filled 2023-02-25: qty 1

## 2023-02-25 MED ORDER — IOHEXOL 300 MG/ML  SOLN
100.0000 mL | Freq: Once | INTRAMUSCULAR | Status: AC | PRN
  Administered 2023-02-25: 100 mL via INTRAVENOUS

## 2023-02-25 NOTE — ED Triage Notes (Addendum)
Patient states she have not been feeling well all day, states she have been having lightheaded, nausea, headache and dizziness. Patient states she was seen here on Sunday, prescribed Clotrimazone, started using Sunday and these symptoms followed. Pain in between right pinky toe.  Patient reports last menstrual 10/1.

## 2023-02-25 NOTE — ED Triage Notes (Signed)
Patient reports recently visiting the urgent and being told that her liver enzymes were elevated. She reports abdominal pain on the right side for a few weeks. Denies vomiting but some nausea. Reports constipation and diarrhea. Pain is rated 4/10.

## 2023-02-25 NOTE — ED Provider Notes (Signed)
[facility] Provider Note   CSN: [id number] Arrival date & time: 02/25/23  1951     History {Add pertinent medical, surgical, social history, OB history to HPI:1} Chief Complaint  Patient presents with   Abdominal Pain    Donna Mcclain is a 27 y.o. female.   Abdominal Pain      Home Medications Prior to Admission medications   Medication Sig Start Date End Date Taking? Authorizing Provider  clotrimazole (LOTRIMIN) 1 % cream Apply to affected area 2 times daily 02/22/23   Ervin Knack E, FNP  doxycycline (VIBRAMYCIN) 100 MG capsule Take 1 capsule (100 mg total) by mouth 2 (two) times daily. 08/09/21   Al Decant, PA-C  ibuprofen (ADVIL) 200 MG tablet Take 400 mg by mouth every 6 (six) hours as needed for headache or mild pain.    [provider]  metroNIDAZOLE (FLAGYL) 500 MG tablet Take 1 tablet (500 mg total) by mouth 2 (two) times daily. 08/09/21   Al Decant, PA-C  nitrofurantoin, macrocrystal-monohydrate, (MACROBID) 100 MG capsule Take 1 capsule (100 mg total) by mouth 2 (two) times daily. Patient taking differently: Take 100 mg by mouth 2 (two) times daily. 5 day course. Pt is on day 3 08/05/21   Sherian Maroon A, PA  ondansetron (ZOFRAN) 4 MG tablet Take 1 tablet (4 mg total) by mouth every 8 (eight) hours as needed for nausea or vomiting. 08/09/21   Al Decant, PA-C  phenazopyridine (PYRIDIUM) 200 MG tablet Take 1 tablet (200 mg total) by mouth 3 (three) times daily as needed for pain (bladder pain). Patient not taking: Reported on 08/09/2021 07/28/21   Valentino Nose, NP      Allergies    Cephalosporins and Penicillins    Review of Systems   Review of Systems  Gastrointestinal:  Positive for abdominal pain.    Physical Exam Updated Vital Signs BP (!) 144/84   Pulse 86   Temp 98.6 F (37 C) (Oral)   Resp 16   Ht 5\' 2"  (1.575 m)   Wt 74.8 kg   LMP 01/20/2023   SpO2 100%    BMI 30.18 kg/m  Physical Exam  ED Results / Procedures / Treatments   Labs (all labs ordered are listed, but only abnormal results are displayed) Labs Reviewed  COMPREHENSIVE METABOLIC PANEL - Abnormal; Notable for the following components:      Result Value   Glucose, Bld 116 (*)    Total Protein 8.3 (*)    All other components within normal limits  URINALYSIS, ROUTINE W REFLEX MICROSCOPIC - Abnormal; Notable for the following components:   Color, Urine STRAW (*)    Specific Gravity, Urine 1.004 (*)    All other components within normal limits  LIPASE, BLOOD  CBC  HCG, SERUM, QUALITATIVE    EKG None  Radiology No results found.  Procedures Procedures  {Document cardiac monitor, telemetry assessment procedure when appropriate:1}  Medications Ordered in ED Medications  ondansetron (ZOFRAN-ODT) disintegrating tablet 4 mg (4 mg Oral Given 02/25/23 2012)    ED Course/ Medical Decision Making/ A&P   {   Click here for ABCD2, HEART and other calculatorsREFRESH Note before signing :1}                              Medical Decision Making Amount and/or Complexity of Data Reviewed Labs: ordered.  Risk Prescription drug  management.   ***  {Document critical care time when appropriate:1} {Document review of labs and clinical decision tools ie heart score, Chads2Vasc2 etc:1}  {Document your independent review of radiology images, and any outside records:1} {Document your discussion with family members, caretakers, and with consultants:1} {Document social determinants of health affecting pt's care:1} {Document your decision making why or why not admission, treatments were needed:1} Final Clinical Impression(s) / ED Diagnoses Final diagnoses:  None    Rx / DC Orders ED Discharge Orders     None

## 2023-02-25 NOTE — ED Notes (Signed)
Patient is being discharged from the Urgent Care and sent to the Emergency Department via pov . Per Erma Pinto, Pa-C, patient is in need of higher level of care due to complexity of care. Patient is aware and verbalizes understanding of plan of care.  Vitals:   02/25/23 1816  BP: 123/84  Pulse: 79  Resp: 16  Temp: 98.2 F (36.8 C)  SpO2: 98%

## 2023-02-25 NOTE — ED Provider Notes (Signed)
Patient here today for evaluation of Not feeling well today.  She states that she has been having some lightheadedness, nausea headache and dizziness.  She states she was prescribed clotrimazole cream on Sunday and is unsure if this is related that symptoms started the next day.  She reports her last period was 10/1.  Urine ordered with significantly elevated urobilinogen and bilirubin.  Recommended further evaluation emergency room for stat blood work and possible imaging.  Patient is agreeable with same and will report to ED via POV.   Tomi Bamberger, PA-C 02/25/23 Windell Moment

## 2023-02-26 MED ORDER — ONDANSETRON 8 MG PO TBDP
ORAL_TABLET | ORAL | 0 refills | Status: DC
Start: 2023-02-26 — End: 2024-01-20

## 2023-02-26 NOTE — Discharge Instructions (Signed)
Your history, exam, workup today was overall reassuring.  I suspect he may have a viral infection leading to some dehydration leading to some of the headaches.  As your headache is improved we feel you are safe for discharge home.  The CT scan did not show evidence of acute cholecystitis or acute appendicitis.  Suspect viral illness.  Please rest and stay hydrated and follow-up with a primary doctor.  If any symptoms change or worsen acutely, please return to the nearest emergency department.

## 2023-03-04 ENCOUNTER — Ambulatory Visit (INDEPENDENT_AMBULATORY_CARE_PROVIDER_SITE_OTHER): Payer: Managed Care, Other (non HMO) | Admitting: Podiatry

## 2023-03-04 ENCOUNTER — Encounter: Payer: Self-pay | Admitting: Podiatry

## 2023-03-04 ENCOUNTER — Ambulatory Visit (INDEPENDENT_AMBULATORY_CARE_PROVIDER_SITE_OTHER): Payer: Managed Care, Other (non HMO)

## 2023-03-04 DIAGNOSIS — L84 Corns and callosities: Secondary | ICD-10-CM

## 2023-03-04 DIAGNOSIS — M7751 Other enthesopathy of right foot: Secondary | ICD-10-CM

## 2023-03-04 DIAGNOSIS — M778 Other enthesopathies, not elsewhere classified: Secondary | ICD-10-CM | POA: Diagnosis not present

## 2023-03-04 MED ORDER — TRIAMCINOLONE ACETONIDE 10 MG/ML IJ SUSP
10.0000 mg | Freq: Once | INTRAMUSCULAR | Status: AC
  Administered 2023-03-04: 10 mg via INTRA_ARTICULAR

## 2023-03-04 NOTE — Progress Notes (Signed)
Subjective:   Patient ID: Donna Mcclain, female   DOB: 27 y.o.   MRN: [id number]   HPI Patient presents with a lot of pain between the fourth and fifth digits right foot stating it has been several months and tried over-the-counter medicines without relief.  Patient does smoke a pack of cigarettes per day and is active to the best she can be.   Review of Systems  All other systems reviewed and are negative.       Objective:  Physical Exam Vitals and nursing note reviewed.  Constitutional:      Appearance: She is well-developed.  Pulmonary:     Effort: Pulmonary effort is normal.  Musculoskeletal:        General: Normal range of motion.  Skin:    General: Skin is warm.  Neurological:     Mental Status: She is alert.     Neurovascular status intact muscle strength found to be adequate range of motion adequate with inflammation pain between the fourth and fifth digits right with interdigital lesion and fluid around the fourth MPJ.  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  If no improvement will probably require syndactyly arthroplasty  X-rays indicate mild enlargement of the head of the proximal phalanx digit 5 right

## 2023-04-30 ENCOUNTER — Ambulatory Visit: Payer: Managed Care, Other (non HMO) | Admitting: Podiatry

## 2023-05-01 ENCOUNTER — Ambulatory Visit: Payer: Managed Care, Other (non HMO) | Admitting: Podiatry

## 2023-06-08 ENCOUNTER — Ambulatory Visit (INDEPENDENT_AMBULATORY_CARE_PROVIDER_SITE_OTHER): Payer: Commercial Managed Care - HMO | Admitting: Orthopedic Surgery

## 2023-06-08 DIAGNOSIS — B353 Tinea pedis: Secondary | ICD-10-CM | POA: Diagnosis not present

## 2023-06-09 ENCOUNTER — Encounter: Payer: Self-pay | Admitting: Orthopedic Surgery

## 2023-06-09 NOTE — Progress Notes (Signed)
 Office Visit Note   Patient: Donna Mcclain           Date of Birth: 1995/08/18           MRN: [id number] Visit Date: 06/08/2023              Requested by: No referring provider defined for this encounter. PCP: Patient, No Pcp Per  Chief Complaint  Patient presents with   Right Foot - Wound Check      HPI: Patient is a 28 year old woman who is seen for initial evaluation for fungal ulceration in the third and fourth webspaces.  Patient states she has had prescription for Lotrimin twice.  She states that she has been diagnosed with inflammatory capsulitis fourth toe MTP joint and has had an injection.  Assessment & Plan: Visit Diagnoses:  1. Fungal infection right foot     Plan: Patient was given a piece of the Vive wear sock to place in the webspaces, to help heal the fungal ulceration.  Recommended a wool sock, recommended wider shoe wear.  Follow-Up Instructions: Return in about 2 months (around 08/06/2023).   Ortho Exam  Patient is alert, oriented, no adenopathy, well-dressed, normal affect, normal respiratory effort. Examination patient has a palpable dorsalis pedis pulse.  Examination of her foot compared to her shoe the foot is narrower than her foot.  Patient has no history of diabetes.  Patient has a fungal ulcer in the fourth and fifth webspaces.  There is no drainage no ascending cellulitis.  Patient has mild symptoms with compression of the third webspace consistent with mild irritation Morton's neuroma.  Review of the radiographs shows no bony abnormality.  Imaging: No results found. No images are attached to the encounter.  Labs: Lab Results  Component Value Date   ESRSEDRATE 38 (H) 03/05/2020   REPTSTATUS 07/30/2021 FINAL 07/28/2021   GRAMSTAIN  03/05/2020    MODERATE WBC PRESENT, PREDOMINANTLY PMN FEW GRAM POSITIVE COCCI IN CHAINS Performed at [facility], [address] [place], [place] [address]    CULT 80,000 COLONIES/mL ESCHERICHIA COLI  (A) 07/28/2021   LABORGA ESCHERICHIA COLI (A) 07/28/2021     Lab Results  Component Value Date   ALBUMIN 4.6 02/25/2023   ALBUMIN 4.4 07/30/2021   ALBUMIN 4.4 08/20/2016    No results found for: "MG" No results found for: "VD25OH"  No results found for: "PREALBUMIN"    Latest Ref Rng & Units 02/25/2023    8:11 PM 08/05/2021    2:10 PM 07/30/2021   11:08 AM  CBC EXTENDED  WBC 4.0 - 10.5 K/uL 7.5  12.6  10.0   RBC 3.87 - 5.11 MIL/uL 4.14  3.99  4.01   Hemoglobin 12.0 - 15.0 g/dL 19.1  47.8  29.5   HCT 36.0 - 46.0 % 37.6  35.2  35.4   Platelets 150 - 400 K/uL 368  434  381      There is no height or weight on file to calculate BMI.  Orders:  No orders of the defined types were placed in this encounter.  No orders of the defined types were placed in this encounter.    Procedures: No procedures performed  Clinical Data: No additional findings.  ROS:  All other systems negative, except as noted in the HPI. Review of Systems  Objective: Vital Signs: There were no vitals taken for this visit.  Specialty Comments:  No specialty comments available.  PMFS History: Patient Active Problem List  Diagnosis Date Noted   Left foot infection 03/07/2020   Screening for HIV (human immunodeficiency virus) 03/07/2020   Encounter for hepatitis C screening test for low risk patient 03/07/2020   Migraine without aura and without status migrainosus, not intractable 07/09/2012   Tension headache 07/09/2012   Acute abdominal pain 03/24/2011   ERYTHRASMA 05/03/2010   BREAST PAIN, BILATERAL 05/03/2010   HIDRADENITIS SUPPURATIVA 02/08/2009   Past Medical History:  Diagnosis Date   Constipation    Hypertension    Seasonal allergies     Family History  Problem Relation Age of Onset   Healthy Mother    Hypertension Father    Migraines Sister     History reviewed. No pertinent surgical history. Social History   Occupational History   Not on file  Tobacco Use   Smoking  status: Every Day    Current packs/day: 1.00    Average packs/day: 1 pack/day for 8.0 years (8.0 ttl pk-yrs)    Types: Cigarettes   Smokeless tobacco: Never   Tobacco comments:    smokes inside w/ children  Vaping Use   Vaping status: Every Day  Substance and Sexual Activity   Alcohol use: Yes    Comment: Socially   Drug use: Yes    Frequency: 5.0 times per week    Types: Marijuana   Sexual activity: Yes    Birth control/protection: None

## 2024-01-13 ENCOUNTER — Encounter (HOSPITAL_COMMUNITY): Payer: Self-pay

## 2024-01-13 ENCOUNTER — Ambulatory Visit (HOSPITAL_COMMUNITY): Admission: EM | Admit: 2024-01-13 | Discharge: 2024-01-13 | Disposition: A | Discharge status: Home / Self Care

## 2024-01-13 DIAGNOSIS — L210 Seborrhea capitis: Secondary | ICD-10-CM

## 2024-01-13 NOTE — ED Triage Notes (Signed)
 Pt states she has ringworm on her torso.  State it started out as one and now she has over 20.  States she was putting bleach on them and now she is using ringworm cream.

## 2024-01-13 NOTE — ED Provider Notes (Signed)
 [facility]    CSN: [id number] Arrival date & time: 01/13/24  1516      History   Chief Complaint Chief Complaint  Patient presents with   Rash    HPI Donna Mcclain is a 28 y.o. female.   Patient here c/w ring worm x 3 weeks.  Spreading rapidly, started with spot L side lower abdomen, now diffuse through abdomen and back.  Admits itchiness.  Denies pain, tenderness, discharge.  Using bleach and lotrim w/o relief.  No one with similar lesion.  She reports she had a URI recently, but sx are resolving, slightly congested and cough remain.    Past Medical History:  Diagnosis Date   Constipation    Hypertension    Seasonal allergies     Patient Active Problem List   Diagnosis Date Noted   Left foot infection 03/07/2020   Screening for HIV (human immunodeficiency virus) 03/07/2020   Encounter for hepatitis C screening test for low risk patient 03/07/2020   Migraine without aura and without status migrainosus, not intractable 07/09/2012   Tension headache 07/09/2012   Acute abdominal pain 03/24/2011   ERYTHRASMA 05/03/2010   BREAST PAIN, BILATERAL 05/03/2010   HIDRADENITIS SUPPURATIVA 02/08/2009    History reviewed. No pertinent surgical history.  OB History   No obstetric history on file.      Home Medications    Prior to Admission medications   Medication Sig Start Date End Date Taking? Authorizing Provider  clotrimazole  (LOTRIMIN ) 1 % cream Apply to affected area 2 times daily 02/22/23   Hazen Sluder E, FNP  doxycycline  (VIBRAMYCIN ) 100 MG capsule Take 1 capsule (100 mg total) by mouth 2 (two) times daily. 08/09/21   Groce, Christopher F, PA-C  ibuprofen  (ADVIL ) 200 MG tablet Take 400 mg by mouth every 6 (six) hours as needed for headache or mild pain.    [provider]  metroNIDAZOLE  (FLAGYL ) 500 MG tablet Take 1 tablet (500 mg total) by mouth 2 (two) times daily. 08/09/21   Ruthell Lonni FALCON, PA-C  nitrofurantoin ,  macrocrystal-monohydrate, (MACROBID ) 100 MG capsule Take 1 capsule (100 mg total) by mouth 2 (two) times daily. Patient taking differently: Take 100 mg by mouth 2 (two) times daily. 5 day course. Pt is on day 3 08/05/21   Silver Wonda LABOR, PA  ondansetron  (ZOFRAN ) 4 MG tablet Take 1 tablet (4 mg total) by mouth every 8 (eight) hours as needed for nausea or vomiting. 08/09/21   Ruthell Lonni FALCON, PA-C  ondansetron  (ZOFRAN -ODT) 8 MG disintegrating tablet 8mg  ODT q8 hours prn nausea 02/26/23   Palumbo, April, MD  phenazopyridine  (PYRIDIUM ) 200 MG tablet Take 1 tablet (200 mg total) by mouth 3 (three) times daily as needed for pain (bladder pain). 07/28/21   Chandra Harlene LABOR, NP    Family History Family History  Problem Relation Age of Onset   Healthy Mother    Hypertension Father    Migraines Sister     Social History Social History   Tobacco Use   Smoking status: Every Day    Current packs/day: 1.00    Average packs/day: 1 pack/day for 8.0 years (8.0 ttl pk-yrs)    Types: Cigarettes   Smokeless tobacco: Never   Tobacco comments:    smokes inside w/ children  Vaping Use   Vaping status: Every Day  Substance Use Topics   Alcohol use: Yes    Comment: Socially   Drug use: Yes    Frequency: 5.0 times per  week    Types: Marijuana     Allergies   Cephalosporins and Penicillins   Review of Systems Review of Systems  Constitutional:  Negative for chills, fatigue and fever.  HENT:  Positive for congestion. Negative for ear pain, nosebleeds, postnasal drip, rhinorrhea, sinus pressure, sinus pain and sore throat.   Eyes:  Negative for pain and redness.  Respiratory:  Positive for cough. Negative for shortness of breath and wheezing.   Gastrointestinal:  Negative for abdominal pain, diarrhea, nausea and vomiting.  Musculoskeletal:  Negative for arthralgias and myalgias.  Skin:  Positive for rash.  Neurological:  Negative for light-headedness and headaches.  Hematological:   Negative for adenopathy. Does not bruise/bleed easily.  Psychiatric/Behavioral:  Negative for confusion and sleep disturbance.      Physical Exam Triage Vital Signs ED Triage Vitals  Encounter Vitals Group     BP 01/13/24 1542 (!) 146/81     Girls Systolic BP Percentile --      Girls Diastolic BP Percentile --      Boys Systolic BP Percentile --      Boys Diastolic BP Percentile --      Pulse Rate 01/13/24 1542 91     Resp 01/13/24 1542 16     Temp 01/13/24 1542 98.1 F (36.7 C)     Temp Source 01/13/24 1542 Oral     SpO2 01/13/24 1542 98 %     Weight --      Height --      Head Circumference --      Peak Flow --      Pain Score 01/13/24 1541 0     Pain Loc --      Pain Education --      Exclude from Growth Chart --    No data found.  Updated Vital Signs BP (!) 146/81 (BP Location: Right Arm)   Pulse 91   Temp 98.1 F (36.7 C) (Oral)   Resp 16   LMP 12/13/2023 (Approximate)   SpO2 98%   Visual Acuity Right Eye Distance:   Left Eye Distance:   Bilateral Distance:    Right Eye Near:   Left Eye Near:    Bilateral Near:     Physical Exam Vitals and nursing note reviewed.  Constitutional:      General: She is not in acute distress.    Appearance: Normal appearance. She is not ill-appearing.  HENT:     Head: Normocephalic and atraumatic.  Eyes:     General: No scleral icterus.    Extraocular Movements: Extraocular movements intact.     Conjunctiva/sclera: Conjunctivae normal.  Pulmonary:     Effort: Pulmonary effort is normal. No respiratory distress.  Musculoskeletal:        General: Normal range of motion.     Cervical back: Normal range of motion. No rigidity.  Skin:    General: Skin is warm.     Coloration: Skin is not jaundiced.     Findings: No rash.     Comments: 12 - 18 1 - 2 cm oval scaly lesions, large, 4 - 5 cm lesion L side lower abdomen  Neurological:     General: No focal deficit present.     Mental Status: She is alert and oriented to  person, place, and time.     Motor: No weakness.     Gait: Gait normal.  Psychiatric:        Mood and Affect: Mood normal.  Behavior: Behavior normal.      UC Treatments / Results  Labs (all labs ordered are listed, but only abnormal results are displayed) Labs Reviewed - No data to display  EKG   Radiology No results found.  Procedures Procedures (including critical care time)  Medications Ordered in UC Medications - No data to display  Initial Impression / Assessment and Plan / UC Course  I have reviewed the triage vital signs and the nursing notes.  Pertinent labs & imaging results that were available during my care of the patient were reviewed by me and considered in my medical decision making (see chart for details).     Unlikely to be ringworm given history,  Final Clinical Impressions(s) / UC Diagnoses   Final diagnoses:  Pityriasis     Discharge Instructions      Return if you have new or worsening symptoms    ED Prescriptions   None    PDMP not reviewed this encounter.   Juleen Rush, PA-C 01/13/24 1652

## 2024-01-13 NOTE — Discharge Instructions (Addendum)
Return if you have new or worsening symptoms.

## 2024-01-20 ENCOUNTER — Ambulatory Visit (HOSPITAL_COMMUNITY)
Admission: RE | Admit: 2024-01-20 | Discharge: 2024-01-20 | Disposition: A | Discharge status: Home / Self Care | Payer: Self-pay | Source: Ambulatory Visit | Attending: Family Medicine | Admitting: Family Medicine

## 2024-01-20 ENCOUNTER — Encounter (HOSPITAL_COMMUNITY): Payer: Self-pay

## 2024-01-20 VITALS — BP 118/84 | HR 102 | Temp 98.5°F | Resp 16

## 2024-01-20 DIAGNOSIS — L089 Local infection of the skin and subcutaneous tissue, unspecified: Secondary | ICD-10-CM | POA: Diagnosis not present

## 2024-01-20 DIAGNOSIS — B9689 Other specified bacterial agents as the cause of diseases classified elsewhere: Secondary | ICD-10-CM

## 2024-01-20 MED ORDER — CIPROFLOXACIN-DEXAMETHASONE 0.3-0.1 % OT SUSP: 4.0000 [drp] | Freq: Two times a day (BID) | OTIC | 0 refills | Status: AC

## 2024-01-20 MED ORDER — HYDROCODONE-ACETAMINOPHEN 5-325 MG PO TABS: 1.0000 | ORAL_TABLET | Freq: Four times a day (QID) | ORAL | 0 refills | Status: AC | PRN

## 2024-01-20 MED ORDER — SULFAMETHOXAZOLE-TRIMETHOPRIM 800-160 MG PO TABS: 1.0000 | ORAL_TABLET | Freq: Two times a day (BID) | ORAL | 0 refills | Status: AC

## 2024-01-20 NOTE — Medical Student Note (Signed)
 [facility] Insurance account manager Note For educational purposes for Medical, PA and NP students only and not part of the legal medical record.   CSN: [id number] Arrival date & time: 01/20/24  1716      History   Chief Complaint Chief Complaint  Patient presents with   Abscess    Abscess in ear jaw and ear pain - Entered by patient    HPI Donna Mcclain is a 28 y.o. female.  Pt with a hx of migraines presents with a painful mass on the inside of her R ear that has been present for a week. She reports it started out as a small bump and has increased in size and pain. She is now having periauricular and pain to the R side of her face. She denies any drainage, fevers, or chills.   The history is provided by the patient.  Abscess Associated symptoms: no fatigue and no fever     Past Medical History:  Diagnosis Date   Constipation    Hypertension    Seasonal allergies     Patient Active Problem List   Diagnosis Date Noted   Left foot infection 03/07/2020   Screening for HIV (human immunodeficiency virus) 03/07/2020   Encounter for hepatitis C screening test for low risk patient 03/07/2020   Migraine without aura and without status migrainosus, not intractable 07/09/2012   Tension headache 07/09/2012   Acute abdominal pain 03/24/2011   ERYTHRASMA 05/03/2010   BREAST PAIN, BILATERAL 05/03/2010   HIDRADENITIS SUPPURATIVA 02/08/2009    History reviewed. No pertinent surgical history.  OB History   No obstetric history on file.      Home Medications    Prior to Admission medications   Not on File    Family History Family History  Problem Relation Age of Onset   Healthy Mother    Hypertension Father    Migraines Sister     Social History Social History   Tobacco Use   Smoking status: Every Day    Current packs/day: 1.00    Average packs/day: 1 pack/day for 8.0 years (8.0 ttl pk-yrs)    Types: Cigarettes   Smokeless tobacco: Never    Tobacco comments:    smokes inside w/ children  Vaping Use   Vaping status: Every Day  Substance Use Topics   Alcohol use: Yes    Comment: Socially   Drug use: Not Currently    Frequency: 5.0 times per week    Types: Marijuana     Allergies   Cephalosporins and Penicillins   Review of Systems Review of Systems  Constitutional:  Negative for chills, fatigue and fever.  HENT:  Positive for ear pain. Negative for ear discharge.   All other systems reviewed and are negative.    Physical Exam Updated Vital Signs BP 118/84 (BP Location: Left Arm)   Pulse (!) 102   Temp 98.5 F (36.9 C) (Oral)   Resp 16   LMP 01/17/2024 (Exact Date)   SpO2 98%   Physical Exam Constitutional:      Appearance: She is not ill-appearing.  HENT:     Right Ear: Tympanic membrane and ear canal normal.     Left Ear: Tympanic membrane, ear canal and external ear normal.     Ears:   Cardiovascular:     Rate and Rhythm: Normal rate and regular rhythm.     Pulses: Normal pulses.     Heart sounds: Normal heart sounds.  Pulmonary:  Effort: Pulmonary effort is normal.     Breath sounds: Normal breath sounds.  Musculoskeletal:     Cervical back: Neck supple.  Lymphadenopathy:     Cervical: No cervical adenopathy.  Neurological:     Mental Status: She is alert.      ED Treatments / Results  Labs (all labs ordered are listed, but only abnormal results are displayed) Labs Reviewed - No data to display  EKG  Radiology No results found.  Procedures Procedures (including critical care time)  Medications Ordered in ED Medications - No data to display   Initial Impression / Assessment and Plan / ED Course  I have reviewed the triage vital signs and the nursing notes.  Pertinent labs & imaging results that were available during my care of the patient were reviewed by me and considered in my medical decision making (see chart for details).     Soft tissue infection to the R  external ear. Differential for malignant otitis externa is considered, but given the pt not ill-appearing and only moderate tenderness, I feel we can treat conservatively with oral antibiotics at this time with strict return precautions if symptoms worsen.   Will treat with Bactrim -DS 1 tablet BID for 7 days. Will also start Ciprodex otic suspension 4 gtts in the R ear twice daily until symptoms resolve. Norco 1 tab every 6 hours PRN for moderate to severe pain. Sedation precautions given. Red flag symptoms reviewed and return precautions given.    Final Clinical Impressions(s) / ED Diagnoses   Final diagnoses:  None    New Prescriptions New Prescriptions   No medications on file

## 2024-01-20 NOTE — Discharge Instructions (Signed)
 Be aware, you have been prescribed pain medications that may cause drowsiness. While taking this medication, do not take any other medications containing acetaminophen (Tylenol). Do not combine with alcohol or recreational drugs. Please do not drive, operate heavy machinery, or take part in activities that require making important decisions while on this medication as your judgement may be clouded.

## 2024-01-20 NOTE — ED Triage Notes (Signed)
 Patient here today with c/o a bump on the inside of her right ear X 1 week that has become more painful and swollen X 2 days. Patient states that the pain radiates down her face.

## 2024-01-23 NOTE — ED Provider Notes (Signed)
 [facility]   [id number] 01/20/24 Arrival Time: 1716  ASSESSMENT & PLAN:  1. Localized bacterial skin infection    No indication for I&D at this time. Trial of : Meds ordered this encounter  Medications   HYDROcodone -acetaminophen  (NORCO/VICODIN) 5-325 MG tablet    Sig: Take 1 tablet by mouth every 6 (six) hours as needed for moderate pain (pain score 4-6) or severe pain (pain score 7-10).    Dispense:  4 tablet    Refill:  0   sulfamethoxazole -trimethoprim  (BACTRIM  DS) 800-160 MG tablet    Sig: Take 1 tablet by mouth 2 (two) times daily for 7 days.    Dispense:  14 tablet    Refill:  0   ciprofloxacin-dexamethasone  (CIPRODEX) OTIC suspension    Sig: Place 4 drops into the right ear 2 (two) times daily.    Dispense:  7.5 mL    Refill:  0    OTC symptom care as needed.    Follow-up Information     [facility].   Specialty: Urgent Care Why: If worsening or failing to improve as anticipated. Contact information: [address] [place]  [address] [phone]                Reviewed expectations re: course of current medical issues. Questions answered. Outlined signs and symptoms indicating need for more acute intervention. Understanding verbalized. After Visit Summary given.   SUBJECTIVE: History from: Patient. Donna Mcclain is a 28 y.o. female. Patient here today with c/o a bump on the inside of her right ear X 1 week that has become more painful and swollen X 2 days. Patient states that the pain radiates down her face.  Denies: fever. Normal PO intake without n/v/d.  OBJECTIVE:  Vitals:   01/20/24 1750  BP: 118/84  Pulse: (!) 102  Resp: 16  Temp: 98.5 F (36.9 C)  TempSrc: Oral  SpO2: 98%    Constitutional:      Appearance: She is not ill-appearing.  HENT:     Right Ear: Tympanic membrane and ear canal normal.     Left Ear: Tympanic membrane, ear canal and external ear normal.      Ears:   Cardiovascular:     Rate and Rhythm: Normal rate and regular rhythm.     Pulses: Normal pulses.     Heart sounds: Normal heart sounds.  Pulmonary:     Effort: Pulmonary effort is normal.     Breath sounds: Normal breath sounds.  Musculoskeletal:     Cervical back: Neck supple.  Lymphadenopathy:     Cervical: No cervical adenopathy.  Neurological:     Mental Status: She is alert.   Labs:  Labs Reviewed - No data to display  Imaging: No results found.  Allergies  Allergen Reactions   Cephalosporins Hives   Penicillins Rash    Patient had as a child Has patient had a PCN reaction causing immediate rash, facial/tongue/throat swelling, SOB or lightheadedness with hypotension: no Has patient had a PCN reaction causing severe rash involving mucus membranes or skin necrosis: no Has patient had a PCN reaction that required hospitalization no Has patient had a PCN reaction occurring within the last 10 years: no If all of the above answers are NO, then may proceed with Cephalosporin use.    Past Medical History:  Diagnosis Date   Constipation    Hypertension    Seasonal allergies    Social History   Socioeconomic  History   Marital status: Single    Spouse name: Not on file   Number of children: Not on file   Years of education: Not on file   Highest education level: Not on file  Occupational History   Not on file  Tobacco Use   Smoking status: Every Day    Current packs/day: 1.00    Average packs/day: 1 pack/day for 8.0 years (8.0 ttl pk-yrs)    Types: Cigarettes   Smokeless tobacco: Never   Tobacco comments:    smokes inside w/ children  Vaping Use   Vaping status: Every Day  Substance and Sexual Activity   Alcohol use: Yes    Comment: Socially   Drug use: Not Currently    Frequency: 5.0 times per week    Types: Marijuana   Sexual activity: Yes    Birth control/protection: None  Other Topics Concern   Not on file  Social History Narrative   Not  on file   Social Drivers of Health   Financial Resource Strain: Not on file  Food Insecurity: Not on file  Transportation Needs: Not on file  Physical Activity: Not on file  Stress: Not on file  Social Connections: Not on file  Intimate Partner Violence: Not on file   Family History  Problem Relation Age of Onset   Healthy Mother    Hypertension Father    Migraines Sister    History reviewed. No pertinent surgical history.   Rolinda Rogue, MD 01/23/24 1014

## 2024-02-17 ENCOUNTER — Encounter (HOSPITAL_COMMUNITY): Payer: Self-pay

## 2024-02-17 ENCOUNTER — Emergency Department (HOSPITAL_COMMUNITY)
Admission: EM | Admit: 2024-02-17 | Discharge: 2024-02-17 | Discharge status: Against Medical Advice | Attending: Emergency Medicine | Admitting: Emergency Medicine

## 2024-02-17 ENCOUNTER — Emergency Department (HOSPITAL_COMMUNITY)

## 2024-02-17 ENCOUNTER — Ambulatory Visit (HOSPITAL_COMMUNITY): Payer: Self-pay

## 2024-02-17 ENCOUNTER — Other Ambulatory Visit: Payer: Self-pay

## 2024-02-17 DIAGNOSIS — Z5321 Procedure and treatment not carried out due to patient leaving prior to being seen by health care provider: Secondary | ICD-10-CM | POA: Insufficient documentation

## 2024-02-17 DIAGNOSIS — R0981 Nasal congestion: Secondary | ICD-10-CM | POA: Insufficient documentation

## 2024-02-17 DIAGNOSIS — R059 Cough, unspecified: Secondary | ICD-10-CM | POA: Insufficient documentation

## 2024-02-17 LAB — RESP PANEL BY RT-PCR (RSV, FLU A&B, COVID)  RVPGX2
Influenza A by PCR: NEGATIVE
Influenza B by PCR: NEGATIVE
Resp Syncytial Virus by PCR: NEGATIVE
SARS Coronavirus 2 by RT PCR: NEGATIVE

## 2024-02-17 NOTE — ED Notes (Signed)
 Pt told staff about leaving ED.

## 2024-02-17 NOTE — ED Triage Notes (Signed)
 PT arrives via POV. Pt reports cough and congestion for the past couple of weeks. States she is concerned she has been around some mold. Pt AxOx4.

## 2024-02-17 NOTE — ED Provider Triage Note (Signed)
 Emergency Medicine Provider Triage Evaluation Note  Donna Mcclain , a 28 y.o. female  was evaluated in triage.  Pt complains of cough, congestion.  Review of Systems  Positive: As above Negative: Fever, illness  Physical Exam  BP 127/77 (BP Location: Right Arm)   Pulse 90   Temp 98.8 F (37.1 C) (Oral)   Resp 17   LMP 01/17/2024 (Exact Date)   SpO2 100%  Gen:   Awake, no distress   Resp:  Normal effort  MSK:   Moves extremities without difficulty  Other:    Medical Decision Making  Medically screening exam initiated at 3:24 PM.  Appropriate orders placed.  Donna Mcclain Staff was informed that the remainder of the evaluation will be completed by another provider, this initial triage assessment does not replace that evaluation, and the importance of remaining in the ED until their evaluation is complete.  Patient w/cough and congestion x 2 weeks. No fever. Does not feel ill.  COVID screening, CXR ordered.   Odell Balls, PA-C 02/17/24 1524

## 2024-02-17 NOTE — ED Notes (Signed)
 Rash over her body that itches  she has mold over most of her belongings

## 2024-02-22 ENCOUNTER — Encounter: Payer: Self-pay | Admitting: Radiology

## 2024-04-06 ENCOUNTER — Ambulatory Visit
Admission: RE | Admit: 2024-04-06 | Discharge: 2024-04-06 | Disposition: A | Discharge status: Home / Self Care | Source: Ambulatory Visit

## 2024-04-06 VITALS — BP 138/87 | HR 104 | Temp 98.0°F | Resp 16

## 2024-04-06 DIAGNOSIS — L42 Pityriasis rosea: Secondary | ICD-10-CM

## 2024-04-06 MED ORDER — PREDNISONE 10 MG (21) PO TBPK: ORAL_TABLET | ORAL | 0 refills | Status: AC

## 2024-04-06 MED ORDER — FEXOFENADINE HCL 60 MG PO TABS: 60.0000 mg | ORAL_TABLET | Freq: Two times a day (BID) | ORAL | 0 refills | Status: AC

## 2024-04-06 NOTE — ED Triage Notes (Signed)
 Pt reports rash that started on her abdomen and has continued spreading to breasts, back, arms, and legs since September 2025. Pt was seen in urgent care in Sept and given diagnosis of pityriasis rosea. After that, she found mold in her home and is unsure if that is the cause of rash. She has not seen a dermatologist. Reports the rash has never improved just continues to spread. Has applied eucerin and EOS cream to the areas. No other treatments. Rash is itchy and has burning sensations intermittently.

## 2024-04-06 NOTE — ED Provider Notes (Signed)
 [name]    CSN: [id number] Arrival date & time: 04/06/24  1453      History   Chief Complaint Chief Complaint  Patient presents with   Rash    Exposed to mold rash getting worse - Entered by patient    HPI Donna Mcclain is a 28 y.o. female.   Pt presents today because she was diagnosed with pityriasis rosea in October and states that she is still experiencing eruption, states that she has noticed new lesions as well. Pt was advised it was self limiting but was concerned that her symptoms might be a symptoms of mold that was found in her apartment in September/October. Pt states that she has not been taking anything to counteract any symptoms that she may have due to interaction with mold in apartment.   The history is provided by the patient.  Rash   Past Medical History:  Diagnosis Date   Constipation    Hypertension    Seasonal allergies     Patient Active Problem List   Diagnosis Date Noted   Left foot infection 03/07/2020   Screening for HIV (human immunodeficiency virus) 03/07/2020   Encounter for hepatitis C screening test for low risk patient 03/07/2020   Migraine without aura and without status migrainosus, not intractable 07/09/2012   Tension headache 07/09/2012   Acute abdominal pain 03/24/2011   ERYTHRASMA 05/03/2010   BREAST PAIN, BILATERAL 05/03/2010   HIDRADENITIS SUPPURATIVA 02/08/2009    History reviewed. No pertinent surgical history.  OB History   No obstetric history on file.      Home Medications    Prior to Admission medications  Medication Sig Start Date End Date Taking? Authorizing Provider  fexofenadine  (ALLEGRA ) 60 MG tablet Take 1 tablet (60 mg total) by mouth 2 (two) times daily. 04/06/24  Yes Andra Corean BROCKS, PA-C  predniSONE  (STERAPRED UNI-PAK 21 TAB) 10 MG (21) TBPK tablet Take as directed on back of package 04/06/24  Yes Andra Corean BROCKS, PA-C  ciprofloxacin -dexamethasone  (CIPRODEX ) OTIC  suspension Place 4 drops into the right ear 2 (two) times daily. Patient not taking: Reported on 04/06/2024 01/20/24   Rolinda Rogue, MD  HYDROcodone -acetaminophen  (NORCO/VICODIN) 5-325 MG tablet Take 1 tablet by mouth every 6 (six) hours as needed for moderate pain (pain score 4-6) or severe pain (pain score 7-10). Patient not taking: Reported on 04/06/2024 01/20/24   Rolinda Rogue, MD    Family History Family History  Problem Relation Age of Onset   Healthy Mother    Hypertension Father    Migraines Sister     Social History Social History[1]   Allergies   Cephalosporins and Penicillins   Review of Systems Review of Systems  Skin:  Positive for rash.     Physical Exam Triage Vital Signs ED Triage Vitals [04/06/24 1518]  Encounter Vitals Group     BP 138/87     Girls Systolic BP Percentile      Girls Diastolic BP Percentile      Boys Systolic BP Percentile      Boys Diastolic BP Percentile      Pulse Rate (!) 104     Resp 16     Temp 98 F (36.7 C)     Temp Source Oral     SpO2 99 %     Weight      Height      Head Circumference      Peak Flow      Pain Score  1     Pain Loc      Pain Education      Exclude from Growth Chart    No data found.  Updated Vital Signs BP 138/87 (BP Location: Left Arm)   Pulse (!) 104   Temp 98 F (36.7 C) (Oral)   Resp 16   LMP 03/22/2024 (Exact Date)   SpO2 99%   Visual Acuity Right Eye Distance:   Left Eye Distance:   Bilateral Distance:    Right Eye Near:   Left Eye Near:    Bilateral Near:     Physical Exam Vitals and nursing note reviewed.  Constitutional:      General: She is not in acute distress.    Appearance: Normal appearance. She is not ill-appearing, toxic-appearing or diaphoretic.  Eyes:     General: No scleral icterus. Cardiovascular:     Rate and Rhythm: Normal rate and regular rhythm.     Heart sounds: Normal heart sounds.  Pulmonary:     Effort: Pulmonary effort is normal. No respiratory  distress.     Breath sounds: Normal breath sounds. No wheezing or rhonchi.  Skin:    General: Skin is warm.     Comments: Diffuse, hyperpigmented, scaly eruption with christmas tree distribution on dermis  Neurological:     Mental Status: She is alert and oriented to person, place, and time.  Psychiatric:        Mood and Affect: Mood normal.        Behavior: Behavior normal.      UC Treatments / Results  Labs (all labs ordered are listed, but only abnormal results are displayed) Labs Reviewed - No data to display  EKG   Radiology No results found.  Procedures Procedures (including critical care time)  Medications Ordered in UC Medications - No data to display  Initial Impression / Assessment and Plan / UC Course  I have reviewed the triage vital signs and the nursing notes.  Pertinent labs & imaging results that were available during my care of the patient were reviewed by me and considered in my medical decision making (see chart for details).     Final Clinical Impressions(s) / UC Diagnoses   Final diagnoses:  Pityriasis rosea     Discharge Instructions      What is pityriasis rosea?  Pityriasis rosea is a skin rash that causes small, itchy spots on the belly, back, chest, arms, and legs. The rash usually lasts about 4 to 6 weeks, but in some people, it can last for months. Pityriasis rosea is most common in older children and young adults. What causes pityriasis rosea?  The cause is not known. But it does not seem to be easily spread from person to person. What are the symptoms of pityriasis rosea?  In many people, the rash starts with 1 round or oval patch. This spot is usually around 1 to 2 inches (2.5 to 5 cm) wide, but might be larger. A day or 2 later, many smaller spots appear. But not everyone gets the large spot before the rest of the rash. The color of the spots can be different in different people. They might be red-brown, pink, dark brown, or dark  gray (picture 1 and picture 2). The spots might be: ?On the belly, back, chest, arms, and legs ?Spread out in a fir tree or Christmas tree pattern on the back ?Itchy ?A little scaly In children, the spots sometimes happen on the face and scalp.  Is there a test for pityriasis rosea?  Maybe. Your doctor or nurse can often tell if you have it by learning about your symptoms and doing an exam. But they might gently scrape the rash or do a different test to get a sample of your skin. Tests on the skin sample can help the doctor tell if you have pityriasis rosea or a different problem. Is there anything I can do on my own to feel better?  Yes. You can: ?Take a special kind of bath called an oatmeal bath. Use water that is lukewarm, not hot. ?Use unscented moisturizing lotion or cream on your skin. ?Try to keep your body cool. How is pityriasis rosea treated?  Most people do not need any treatment. If your symptoms bother you, your doctor might prescribe creams or ointments to help with itching. In rare cases, doctors prescribe other medicines or a special type of treatment that uses lights, called phototherapy. All topics are updated as new evidence becomes available and our peer review process is complete. This topic retrieved from UpToDate on: Apr 06, 2024. Disclaimer: This generalized information is a limited summary of diagnosis, treatment, and/or medication information. It is not meant to be comprehensive and should be used as a tool to help the user understand and/or assess potential diagnostic and treatment options. It does NOT include all information about conditions, treatments, medications, side effects, or risks that may apply to a specific patient. It is not intended to be medical advice or a substitute for the medical advice, diagnosis, or treatment of a health care provider based on the health care provider's examination and assessment of a patient's specific and unique circumstances.  Patients must speak with a health care provider for complete information about their health, medical questions, and treatment options, including any risks or benefits regarding use of medications. This information does not endorse any treatments or medications as safe, effective, or approved for treating a specific patient. Up[employer] and its affiliates disclaim any warranty or liability relating to this information or the use thereof. The use of this information is governed by the Terms of Use, available at https://www.wolterskluwer.com/en/know/clinical-effectiveness-terms. 2025 [employer] and its affiliates and/or licensors. All rights reserved. Topic 17226 Version 9.0    ED Prescriptions     Medication Sig Dispense Auth. Provider   predniSONE  (STERAPRED UNI-PAK 21 TAB) 10 MG (21) TBPK tablet Take as directed on back of package 21 tablet Andra Krabbe C, PA-C   fexofenadine  (ALLEGRA ) 60 MG tablet Take 1 tablet (60 mg total) by mouth 2 (two) times daily. 60 tablet Andra Krabbe BROCKS, PA-C      PDMP not reviewed this encounter.    [1]  Social History Tobacco Use   Smoking status: Every Day    Current packs/day: 1.00    Average packs/day: 1 pack/day for 8.0 years (8.0 ttl pk-yrs)    Types: Cigarettes   Smokeless tobacco: Never   Tobacco comments:    smokes inside w/ children  Vaping Use   Vaping status: Every Day  Substance Use Topics   Alcohol use: Yes    Comment: Socially   Drug use: Not Currently    Frequency: 5.0 times per week    Types: Marijuana     Andra Krabbe BROCKS, PA-C 04/06/24 1542

## 2024-04-06 NOTE — Discharge Instructions (Signed)
 What is pityriasis rosea?  Pityriasis rosea is a skin rash that causes small, itchy spots on the belly, back, chest, arms, and legs. The rash usually lasts about 4 to 6 weeks, but in some people, it can last for months. Pityriasis rosea is most common in older children and young adults. What causes pityriasis rosea?  The cause is not known. But it does not seem to be easily spread from person to person. What are the symptoms of pityriasis rosea?  In many people, the rash starts with 1 round or oval patch. This spot is usually around 1 to 2 inches (2.5 to 5 cm) wide, but might be larger. A day or 2 later, many smaller spots appear. But not everyone gets the large spot before the rest of the rash. The color of the spots can be different in different people. They might be red-brown, pink, dark brown, or dark gray (picture 1 and picture 2). The spots might be: ?On the belly, back, chest, arms, and legs ?Spread out in a fir tree or Christmas tree pattern on the back ?Itchy ?A little scaly In children, the spots sometimes happen on the face and scalp. Is there a test for pityriasis rosea?  Maybe. Your doctor or nurse can often tell if you have it by learning about your symptoms and doing an exam. But they might gently scrape the rash or do a different test to get a sample of your skin. Tests on the skin sample can help the doctor tell if you have pityriasis rosea or a different problem. Is there anything I can do on my own to feel better?  Yes. You can: ?Take a special kind of bath called an oatmeal bath. Use water that is lukewarm, not hot. ?Use unscented moisturizing lotion or cream on your skin. ?Try to keep your body cool. How is pityriasis rosea treated?  Most people do not need any treatment. If your symptoms bother you, your doctor might prescribe creams or ointments to help with itching. In rare cases, doctors prescribe other medicines or a special type of treatment that uses lights,  called phototherapy. All topics are updated as new evidence becomes available and our peer review process is complete. This topic retrieved from UpToDate on: Apr 06, 2024. Disclaimer: This generalized information is a limited summary of diagnosis, treatment, and/or medication information. It is not meant to be comprehensive and should be used as a tool to help the user understand and/or assess potential diagnostic and treatment options. It does NOT include all information about conditions, treatments, medications, side effects, or risks that may apply to a specific patient. It is not intended to be medical advice or a substitute for the medical advice, diagnosis, or treatment of a health care provider based on the health care provider's examination and assessment of a patient's specific and unique circumstances. Patients must speak with a health care provider for complete information about their health, medical questions, and treatment options, including any risks or benefits regarding use of medications. This information does not endorse any treatments or medications as safe, effective, or approved for treating a specific patient. [employer], [employer] and its affiliates disclaim any warranty or liability relating to this information or the use thereof. The use of this information is governed by the Terms of Use, available at https://www.wolterskluwer.com/en/know/clinical-effectiveness-terms. 2025 [employer], [employer] and its affiliates and/or licensors. All rights reserved. Topic 17226 Version 9.0
